# Patient Record
Sex: Male | Born: 1955 | Race: White | Hispanic: No | Marital: Married | Smoking: Former smoker
Health system: Southern US, Community
[De-identification: ages and names within clinical notes are randomized; demographics above are authoritative.]

## PROBLEM LIST (undated history)

## (undated) DIAGNOSIS — I8500 Esophageal varices without bleeding: Secondary | ICD-10-CM

## (undated) DIAGNOSIS — E079 Disorder of thyroid, unspecified: Secondary | ICD-10-CM

## (undated) DIAGNOSIS — F419 Anxiety disorder, unspecified: Secondary | ICD-10-CM

## (undated) DIAGNOSIS — I10 Essential (primary) hypertension: Secondary | ICD-10-CM

## (undated) DIAGNOSIS — K859 Acute pancreatitis without necrosis or infection, unspecified: Secondary | ICD-10-CM

## (undated) HISTORY — DX: Esophageal varices without bleeding: I85.00

## (undated) HISTORY — DX: Anxiety disorder, unspecified: F41.9

## (undated) HISTORY — DX: Acute pancreatitis without necrosis or infection, unspecified: K85.90

## (undated) HISTORY — DX: Essential (primary) hypertension: I10

## (undated) HISTORY — PX: HERNIA REPAIR: SHX51

## (undated) HISTORY — DX: Disorder of thyroid, unspecified: E07.9

---

## 2011-03-09 ENCOUNTER — Encounter (INDEPENDENT_AMBULATORY_CARE_PROVIDER_SITE_OTHER): Payer: Self-pay | Admitting: *Deleted

## 2011-05-10 ENCOUNTER — Ambulatory Visit (INDEPENDENT_AMBULATORY_CARE_PROVIDER_SITE_OTHER): Payer: Self-pay | Admitting: Internal Medicine

## 2011-05-30 ENCOUNTER — Other Ambulatory Visit (INDEPENDENT_AMBULATORY_CARE_PROVIDER_SITE_OTHER): Payer: Self-pay | Admitting: Internal Medicine

## 2011-05-30 ENCOUNTER — Encounter (INDEPENDENT_AMBULATORY_CARE_PROVIDER_SITE_OTHER): Payer: Self-pay | Admitting: Internal Medicine

## 2011-05-30 ENCOUNTER — Other Ambulatory Visit (INDEPENDENT_AMBULATORY_CARE_PROVIDER_SITE_OTHER): Payer: Self-pay | Admitting: *Deleted

## 2011-05-30 ENCOUNTER — Ambulatory Visit (INDEPENDENT_AMBULATORY_CARE_PROVIDER_SITE_OTHER): Payer: BC Managed Care – PPO | Admitting: Internal Medicine

## 2011-05-30 VITALS — BP 122/80 | HR 78 | Temp 98.1°F | Resp 20 | Ht 70.0 in | Wt 160.6 lb

## 2011-05-30 DIAGNOSIS — I10 Essential (primary) hypertension: Secondary | ICD-10-CM | POA: Insufficient documentation

## 2011-05-30 DIAGNOSIS — I864 Gastric varices: Secondary | ICD-10-CM

## 2011-05-30 DIAGNOSIS — D696 Thrombocytopenia, unspecified: Secondary | ICD-10-CM

## 2011-05-30 DIAGNOSIS — D7389 Other diseases of spleen: Secondary | ICD-10-CM

## 2011-05-30 DIAGNOSIS — I82890 Acute embolism and thrombosis of other specified veins: Secondary | ICD-10-CM

## 2011-05-30 DIAGNOSIS — I8500 Esophageal varices without bleeding: Secondary | ICD-10-CM | POA: Insufficient documentation

## 2011-05-30 DIAGNOSIS — Z8719 Personal history of other diseases of the digestive system: Secondary | ICD-10-CM | POA: Insufficient documentation

## 2011-05-30 LAB — CBC WITH DIFFERENTIAL/PLATELET
Basophils Absolute: 0.1 10*3/uL (ref 0.0–0.1)
Basophils Relative: 1 % (ref 0–1)
Eosinophils Absolute: 0.1 10*3/uL (ref 0.0–0.7)
Hemoglobin: 12.8 g/dL — ABNORMAL LOW (ref 13.0–17.0)
MCH: 30.8 pg (ref 26.0–34.0)
MCHC: 33.5 g/dL (ref 30.0–36.0)
Neutro Abs: 3.1 10*3/uL (ref 1.7–7.7)
Neutrophils Relative %: 64 % (ref 43–77)
Platelets: 101 10*3/uL — ABNORMAL LOW (ref 150–400)

## 2011-05-30 LAB — PROTIME-INR
INR: 1.01 (ref <1.50)
Prothrombin Time: 13.7 seconds (ref 11.6–15.2)

## 2011-05-30 NOTE — Patient Instructions (Signed)
Do not take more than 2 g of Tylenol per day. Please do not eat raw fish. Physician will contact you with results of ultrasound and blood work.

## 2011-05-30 NOTE — Consult Note (Signed)
Consult note dictated. Job [phone]

## 2011-05-31 LAB — COMPREHENSIVE METABOLIC PANEL
Albumin: 4.7 g/dL (ref 3.5–5.2)
Alkaline Phosphatase: 88 U/L (ref 39–117)
CO2: 24 mEq/L (ref 19–32)
Calcium: 9.5 mg/dL (ref 8.4–10.5)
Chloride: 102 mEq/L (ref 96–112)
Glucose, Bld: 106 mg/dL — ABNORMAL HIGH (ref 70–99)
Potassium: 4.1 mEq/L (ref 3.5–5.3)
Sodium: 141 mEq/L (ref 135–145)
Total Protein: 7.2 g/dL (ref 6.0–8.3)

## 2011-05-31 LAB — IRON AND TIBC
%SAT: 25 % (ref 20–55)
Iron: 83 ug/dL (ref 42–165)
TIBC: 330 ug/dL (ref 215–435)

## 2011-05-31 LAB — HEPATITIS B SURFACE ANTIGEN: Hepatitis B Surface Ag: NEGATIVE

## 2011-05-31 NOTE — Consult Note (Signed)
NAME:  Noah, Griffin                   ACCOUNT NO.:  MEDICAL RECORD NO.:  [id number]  LOCATION:                                 FACILITY:  PHYSICIAN:  Lionel December, M.D.    DATE OF BIRTH:  16-Feb-1956  DATE OF CONSULTATION:  05/30/2011 DATE OF DISCHARGE:                                CONSULTATION   REASON FOR CONSULTATION:  History of alcoholic hepatitis.  Evaluate the patient for chronic liver disease and/or cirrhosis.  HISTORY OF PRESENT ILLNESS:  Noah Griffin is a 56 year old Caucasian male who is referred through courtesy of Dr. Sherril Croon, for GI evaluation.  The patient is known to me from previous evaluation, going back to July 2009, when he was hospitalized for fulminant alcoholic pancreatitis and also noted to have alcoholic hepatitis.  He had recent lab studies by Dr. Sherril Croon, and noted to have persistent thrombocytopenia.  His serum ferritin was also elevated.  He is therefore been concerned and interested in knowing what is the stage of his liver disease.  He has no complaints.  He has very good appetite.  His weight has been stable.  He denies heartburn, nausea, vomiting, dysphagia, abdominal pain, melena, or rectal bleeding.  He has occasional constipation, but he is able to control by eating fiber rich foods.  There is no history of blood transfusion.  He did have 2 tattoos by a professional about 2 years ago.  The only time he was jaundiced was when he had severe pancreatitis in 2009.  He has not been vaccinated for hepatitis A or B. He has not had any alcohol since July 2009.  His family history is negative for chronic liver disease.  CURRENT MEDICATIONS: 1. Acetaminophen 500 mg p.o. b.i.d. p.r.n. 2. Citalopram 20 mg p.o. daily. 3. Levothyroxine 125 mcg p.o. daily. 4. Losartan/HCTZ 50/12.5 mg daily. 5. Pantoprazole 40 mg p.o. q.a.m. 6. Temazepam 15 mg p.o. at bedtime p.r.n. 7. Trazodone 50 mg p.o. at bedtime p.r.n.  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  He had pseudocyst, but he improved with medical therapy.  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  History of alcoholic hepatitis.  However, his transaminases have been normal in September 22 2009, December 2011, March 2012, August 2012, November 2012.  History of thrombocytopenia as noted above.  His platelet count has ranged between 106 and 117,000.  Hypertension.  Hypothyroidism.  History of anxiety.  Prior surgeries include right inguinal herniorrhaphy.  ALLERGIES:  NK.  FAMILY HISTORY:  Father died of pancreatic carcinoma within few months of diagnosis at age 32.  Mother lived to be in her late 83s, she had Parkinson disease.  The patient does not have any siblings.  SOCIAL HISTORY:  He is married (second marriage).  He has 2 grown up children.  His son is in good health.  Daughter had bariatric surgery and is doing fine.  He has Psychiatrist.  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  He has never smoked cigarettes, but he smokes  1 cigar a day which he has done for 3 years.  He used to drink 6-8 cans of beer every day off and on for years, but he has not had any in since July 2009.  In addition to doing photography of galaxies, he is also Civil Service fast streamer and astrophotography.  PHYSICAL EXAMINATION:  VITAL SIGNS:  Weight 160.6 pounds, he is 70 inches tall, pulse 78 per minute, blood pressure 122/80, respirations 20, and temp is 98.1. HEENT:  Conjunctivae is pink.  Sclerae nonicteric.  Oropharyngeal mucosa is normal.  No KF rings noted.  No neck masses or thyromegaly noted. GENERAL:  He does not have spider angiomata or gynecomastia. CARDIAC:  With regular rhythm.  Normal S1, S2.  No murmur or gallop noted. LUNGS:  Clear to auscultation. ABDOMEN:  Scaphoid.  Bowel sounds are  normal.  On palpation, soft, and nontender.  Spleen is not palpable.  Liver edge is indistinct below RCM span is 13 cm. RECTAL:  Deferred. EXTERNAL GENITALIA:  Normal. EXTREMITIES:  No peripheral edema or clubbing noted. NEUROLOGIC:  The patient is alert and does not have asterixis.  LABORATORY DATA:  Serum ferritin levels as follows 627 on February 19, 2011, 620 on January 31, 2011.  Blood work from January 31, 2011, WBC 4.2, H and H 12.3 and 36.5, platelet count 117,000, glucose 102, BUN 10, creatinine 0.96.  Sodium 133, potassium 4.2, chloride 93 CO2 of 23, calcium 9.5, bilirubin 1.3, AP 83, AST 28, ALT 23, total protein 7.4 with albumin of 4.9.  CT results from October 15, 2009, reveals normal-appearing liver, diffuse pancreatic atrophy with resolution of pseudo-cysteine on prior study of February 19, 2008.  Splenic vein thrombosis with gastroesophageal varices.  No evidence of ascites.  ASSESSMENT:  Noah Griffin is a pleasant 56 year old Caucasian male, who has chronic mild thrombocytopenia, mildly elevated serum ferritin levels. He has history of fulminant alcoholic pancreatitis back in July 2009, with full recovery.  His pancreas is atrophic, but he appears to have preserved function given that he is not diabetic and/or has pancreatic insufficiency.  His pancreatitis was complicated by splenic vein thrombosis, splenomegaly, and esophagogastric varices, but he has never undergone EGD.  He does not drink alcohol anymore.  His transaminases repeatedly have been normal.  His mild thrombocytopenia maybe a clue that he has underlying cirrhosis.  Even if he has cirrhosis, it appears to be well compensated and as long as this is not active, he should have good prognosis.  We may end up doing liver biopsy to get an accurate diagnosis, however, before that decision is made, we need to rule out other conditions.  RECOMMENDATIONS: 1. He will have the following lab studies; CBC,  comprehensive     chemistry panel, sed rate, ANA, AMA, INR, AFB, iron TIBC, and     ferritin, antismooth muscle antibody, alpha-1 antitrypsin, and     ceruloplasmin.  We will also check hepatitis B surface antigen and     hepatitis C virus antibody. 2. Hepatobiliary ultrasound. 3. When a bowel study has been completed and reviewed, we will     consider esophagogastroduodenoscopy and colonoscopy (screening). 4. The need for liver biopsy will be deferred until preliminary     evaluation has been completed.  We appreciate the opportunity to participate in the care of this gentleman.          ______________________________ Lionel December, M.D.     NR/MEDQ  D:  05/30/2011  T:  05/30/2011  Job:  [id number]  cc:  Dr. Sherril Croon

## 2011-06-01 LAB — MITOCHONDRIAL ANTIBODIES: Mitochondrial M2 Ab, IgG: 0.22 (ref <0.91)

## 2011-06-01 NOTE — Progress Notes (Signed)
CONSULTATION  REASON FOR CONSULTATION: History of alcoholic hepatitis. Evaluate the  patient for chronic liver disease and/or cirrhosis.  HISTORY OF PRESENT ILLNESS: Noah Griffin is a 56 year old Caucasian male who is  referred through courtesy of Dr. Sherril Croon, for GI evaluation. The patient  is known to me from previous evaluation, going back to July 2009, when  he was hospitalized for fulminant alcoholic pancreatitis and also noted  to have alcoholic hepatitis.  He had recent lab studies by Dr. Sherril Croon, and noted to have persistent  thrombocytopenia. His serum ferritin was also elevated. He is  therefore been concerned and interested in knowing what is the stage of  his liver disease.  He has no complaints. He has very good appetite. His weight has been  stable. He denies heartburn, nausea, vomiting, dysphagia, abdominal  pain, melena, or rectal bleeding. He has occasional constipation, but  he is able to control by eating fiber rich foods. There is no history  of blood transfusion. He did have 2 tattoos by a professional about 2  years ago. The only time he was jaundiced was when he had severe  pancreatitis in 2009. He has not been vaccinated for hepatitis A or B.  He has not had any alcohol since July 2009.  His family history is negative for chronic liver disease.  CURRENT MEDICATIONS:  1. Acetaminophen 500 mg p.o. b.i.d. p.r.n.  2. Citalopram 20 mg p.o. daily.  3. Levothyroxine 125 mcg p.o. daily.  4. Losartan/HCTZ 50/12.5 mg daily.  5. Pantoprazole 40 mg p.o. q.a.m.  6. Temazepam 15 mg p.o. at bedtime p.r.n.  7. Trazodone 50 mg p.o. at bedtime p.r.n.  PAST MEDICAL HISTORY: Fulminant alcoholic pancreatitis for which he was  admitted to [facility] in July 2009. He had pseudocyst, but he improved with  medical therapy. 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  History of alcoholic hepatitis. However, his transaminases have been    normal in September 22 2009, December 2011, March 2012, August 2012, November  2012.  History of thrombocytopenia as noted above. His platelet count has  ranged between 106 and 117,000.  Hypertension.  Hypothyroidism.  History of anxiety.  Prior surgeries include right inguinal herniorrhaphy.  ALLERGIES: NK.  FAMILY HISTORY: Father died of pancreatic carcinoma within few months  of diagnosis at age 75. Mother lived to be in her late 53s, she had  Parkinson disease. The patient does not have any siblings.  SOCIAL HISTORY: He is married (second marriage). He has 2 grown up  children. His son is in good health. Daughter had bariatric surgery  and is doing fine. 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 He has never smoked cigarettes, but he smokes 1 cigar a  day which he has done for 3 years. He used to drink 6-8 cans of beer  every day off and on for years, but he has not had any in since July  2009. In addition to doing photography of galaxies, he is also Ship broker and astrophotography.  PHYSICAL EXAMINATION: VITAL SIGNS: Weight 160.6 pounds, he is 70  inches tall, pulse 78 per minute, blood pressure 122/80, respirations  20, and temp is 98.1.  HEENT: Conjunctivae is pink. Sclerae nonicteric. Oropharyngeal mucosa  is normal. No KF rings noted. No neck masses or thyromegaly noted.  GENERAL: He does not have spider angiomata or gynecomastia.  CARDIAC: With regular rhythm. Normal S1, S2. No murmur or gallop  noted.  LUNGS: Clear to auscultation.  ABDOMEN: Scaphoid. Bowel sounds are normal. On palpation, soft, and  nontender. Spleen is not palpable. Liver edge is indistinct below RCM  span is 13 cm.  RECTAL: Deferred.  EXTERNAL GENITALIA: Normal.  EXTREMITIES: No peripheral edema or clubbing noted.  NEUROLOGIC: The patient is alert and does not have asterixis.  LABORATORY DATA: Serum ferritin  levels as follows 627 on February 19, 2011, 620 on January 31, 2011.  Blood work from January 31, 2011, WBC 4.2, H and H 12.3 and 36.5,  platelet count 117,000, glucose 102, BUN 10, creatinine 0.96. Sodium  133, potassium 4.2, chloride 93 CO2 of 23, calcium 9.5, bilirubin 1.3,  AP 83, AST 28, ALT 23, total protein 7.4 with albumin of 4.9.  CT results from October 15, 2009, reveals normal-appearing liver, diffuse  pancreatic atrophy with resolution of pseudo-cysteine on prior study of  February 19, 2008.  Splenic vein thrombosis with gastroesophageal varices. No evidence of  ascites.  ASSESSMENT: Noah Griffin is a pleasant 56 year old Caucasian male, who has  chronic mild thrombocytopenia, mildly elevated serum ferritin levels.  He has history of fulminant alcoholic pancreatitis back in July 2009,  with full recovery. His pancreas is atrophic, but he appears to have  preserved function given that he is not diabetic and/or has pancreatic  insufficiency. His pancreatitis was complicated by splenic vein  thrombosis, splenomegaly, and esophagogastric varices, but he has never  undergone EGD.  He does not drink alcohol anymore. His transaminases repeatedly have  been normal. His mild thrombocytopenia maybe a clue that he has  underlying cirrhosis. Even if he has cirrhosis, it appears to be well  compensated and as long as this is not active, he should have good  prognosis. We may end up doing liver biopsy to get an accurate  diagnosis, however, before that decision is made, we need to rule out  other conditions.  RECOMMENDATIONS:  1. He will have the following lab studies; CBC, comprehensive  chemistry panel, sed rate, ANA, AMA, INR, AFB, iron TIBC, and  ferritin, antismooth muscle antibody, alpha-1 antitrypsin, and  ceruloplasmin. We will also check hepatitis B surface antigen and  hepatitis C virus antibody.  2. Hepatobiliary ultrasound.  3. When a bowel study has been completed and reviewed,  we will  consider esophagogastroduodenoscopy and colonoscopy (screening).  4. The need for liver biopsy will be deferred until preliminary  evaluation has been completed.  We appreciate the opportunity to participate in the care of this  gentleman.

## 2011-06-13 ENCOUNTER — Ambulatory Visit (HOSPITAL_COMMUNITY)
Admission: RE | Admit: 2011-06-13 | Discharge: 2011-06-13 | Disposition: A | Discharge status: Home / Self Care | Payer: BC Managed Care – PPO | Source: Ambulatory Visit | Attending: Internal Medicine | Admitting: Internal Medicine

## 2011-06-13 DIAGNOSIS — I864 Gastric varices: Secondary | ICD-10-CM

## 2011-06-13 DIAGNOSIS — D696 Thrombocytopenia, unspecified: Secondary | ICD-10-CM

## 2011-06-13 DIAGNOSIS — K7460 Unspecified cirrhosis of liver: Secondary | ICD-10-CM | POA: Insufficient documentation

## 2011-06-16 ENCOUNTER — Encounter (INDEPENDENT_AMBULATORY_CARE_PROVIDER_SITE_OTHER): Payer: Self-pay

## 2011-06-20 ENCOUNTER — Telehealth (INDEPENDENT_AMBULATORY_CARE_PROVIDER_SITE_OTHER): Payer: Self-pay | Admitting: *Deleted

## 2011-06-20 NOTE — Telephone Encounter (Signed)
No varices identified. Spleen normal size. Pancreas not well seen because of overlying gas. Tammy,please call patient.

## 2011-06-20 NOTE — Telephone Encounter (Signed)
  Noah Griffin called the office on 06-19-11.He left a message that he had spoken with Dr.Rehman about the results of his U/S. He says that he has additional questions. I returned his call to get his questions and they are as follows: 1. Did the U/S show if his varices were on the outside or inside? 2. Was my Spleen okay ? 3. Pancreas was atrophied in a previous test,how did the U/S show the Pancreas doing? What are the ramifications of my pancreas, what shall I expect?  Patient may be reached at [phone]. Patient was advised that either Dr.Rehman or myself would call him back with the answers to these questions.

## 2011-06-21 NOTE — Telephone Encounter (Signed)
Noah Griffin was called and all his questions were answered. He states that he will continue to take his pantoprazole as directed by Dr.Rehman. I ask the patient was he pleased with the responses to his questions and he answered yes he was and appreciated of the quick response , no further questions at this time.

## 2011-07-04 ENCOUNTER — Other Ambulatory Visit (INDEPENDENT_AMBULATORY_CARE_PROVIDER_SITE_OTHER): Payer: Self-pay | Admitting: *Deleted

## 2011-07-04 DIAGNOSIS — K7460 Unspecified cirrhosis of liver: Secondary | ICD-10-CM

## 2011-07-17 ENCOUNTER — Encounter (INDEPENDENT_AMBULATORY_CARE_PROVIDER_SITE_OTHER): Payer: Self-pay | Admitting: *Deleted

## 2011-07-17 ENCOUNTER — Encounter (INDEPENDENT_AMBULATORY_CARE_PROVIDER_SITE_OTHER): Payer: Self-pay

## 2011-08-01 ENCOUNTER — Ambulatory Visit (INDEPENDENT_AMBULATORY_CARE_PROVIDER_SITE_OTHER): Payer: BC Managed Care – PPO | Admitting: Internal Medicine

## 2013-01-24 IMAGING — US US ABDOMEN COMPLETE
1 series · 14 of 25 positions shown · non-contrast
Comparison: CT abdomen dated 11/18/2007

CLINICAL DATA: Cirrhosis

COMPLETE ABDOMINAL ULTRASOUND

[Series 1: us abdomen complete · 0.23mm/px · 14 of 85 slices shown]
[im 1/85]
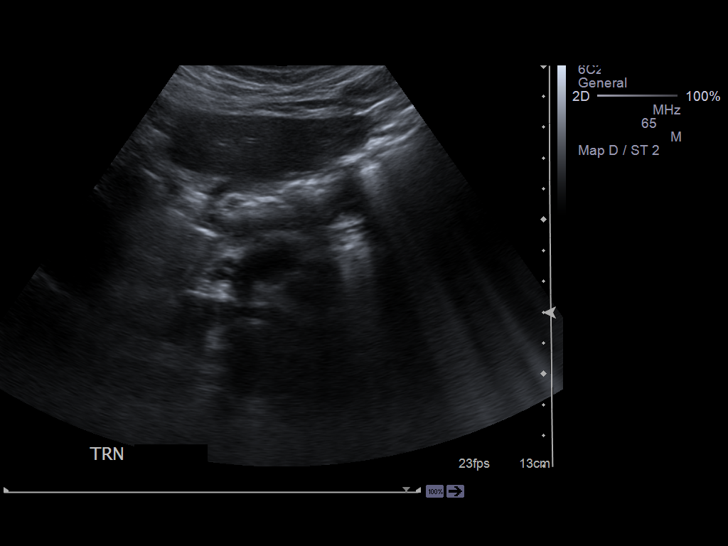
[im 8/85]
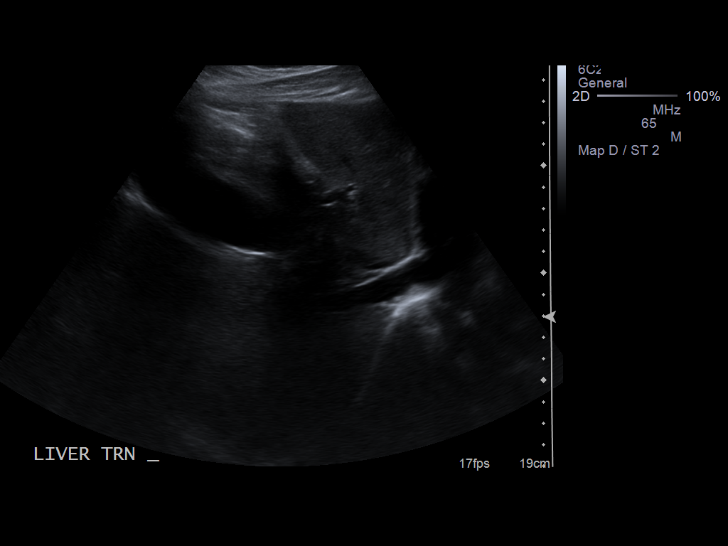
[im 15/85]
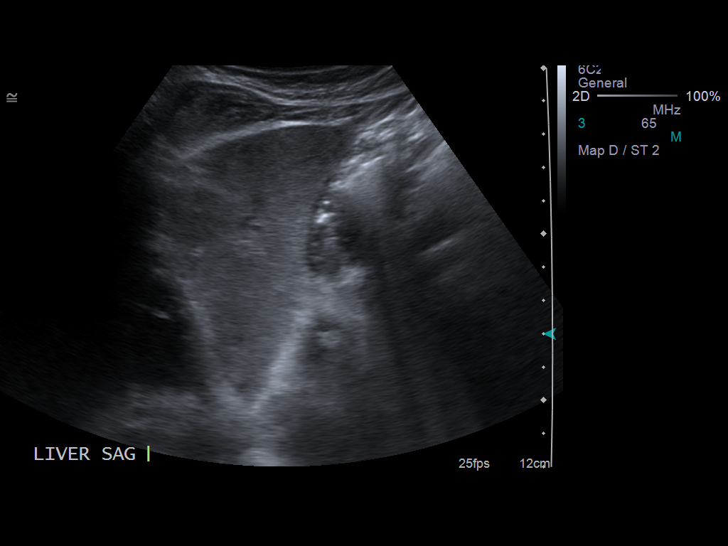
[im 22/85]
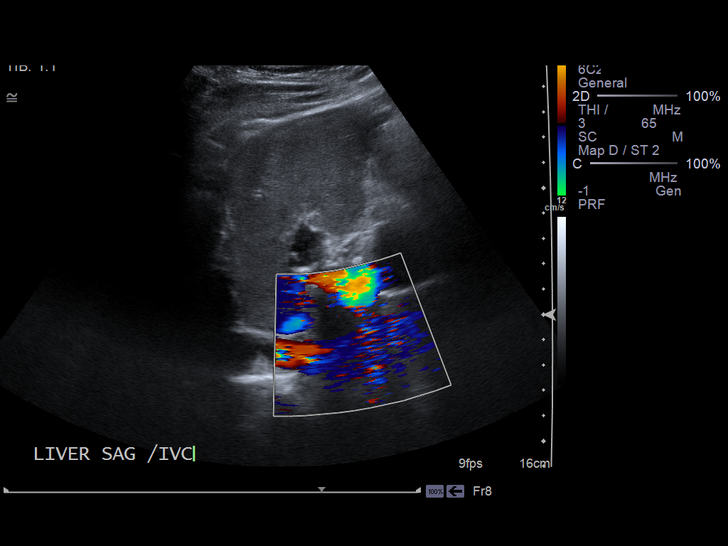
[im 29/85]
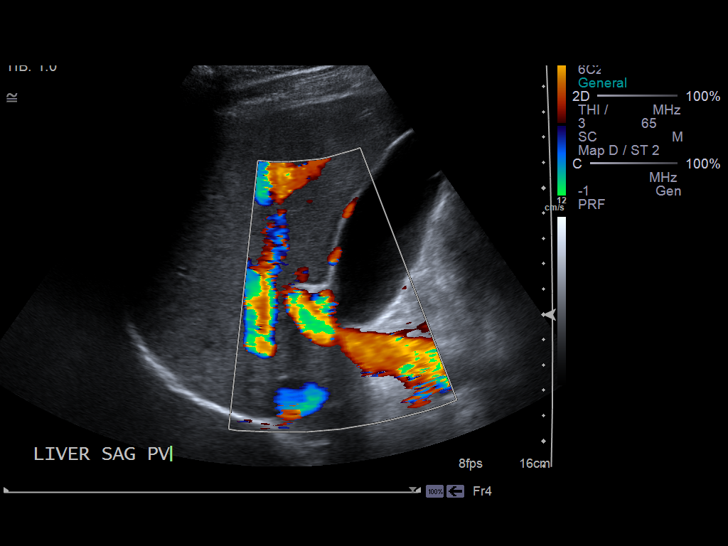
[im 32/85]
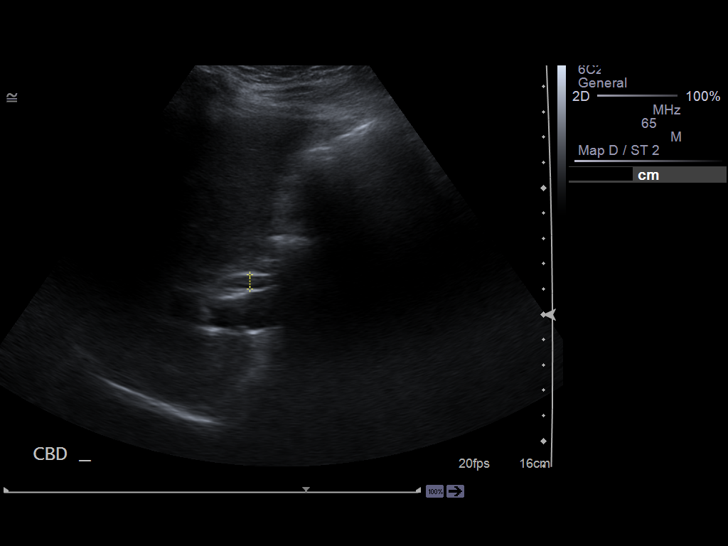
[im 39/85]
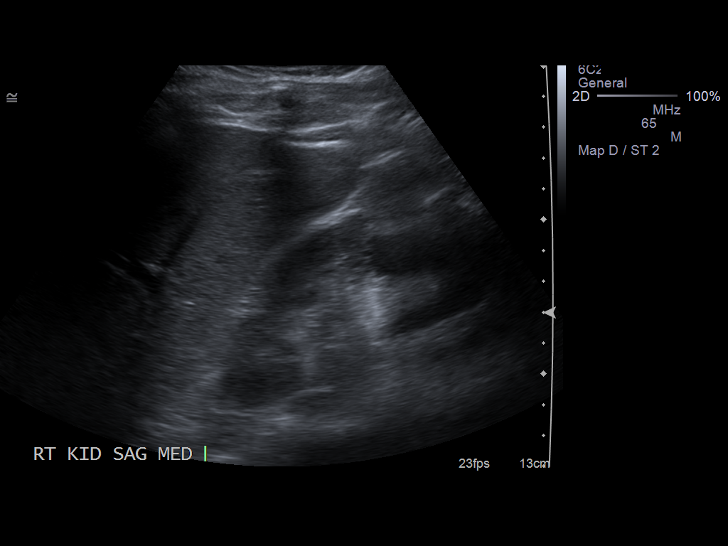
[im 46/85]
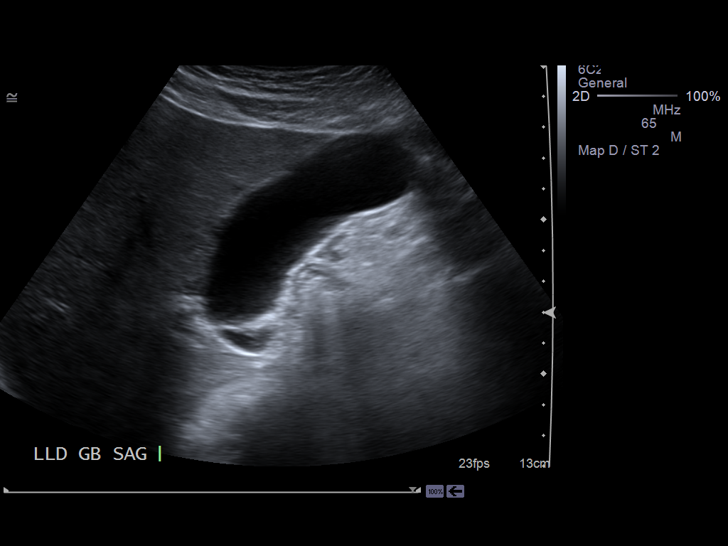
[im 53/85]
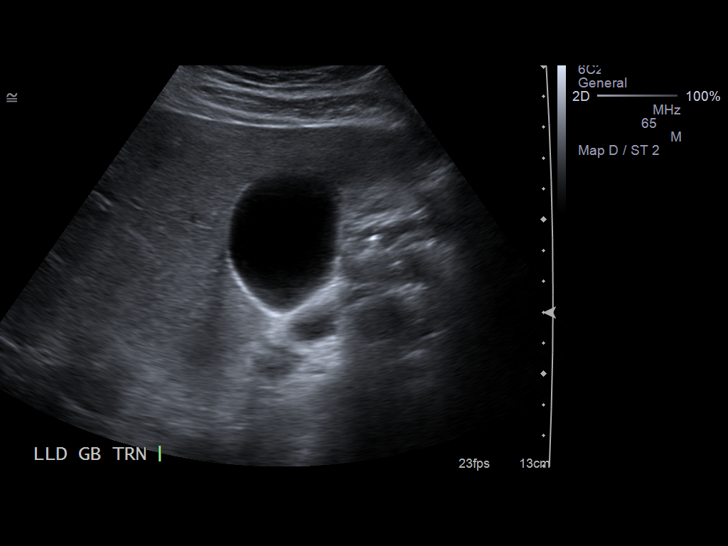
[im 57/85]
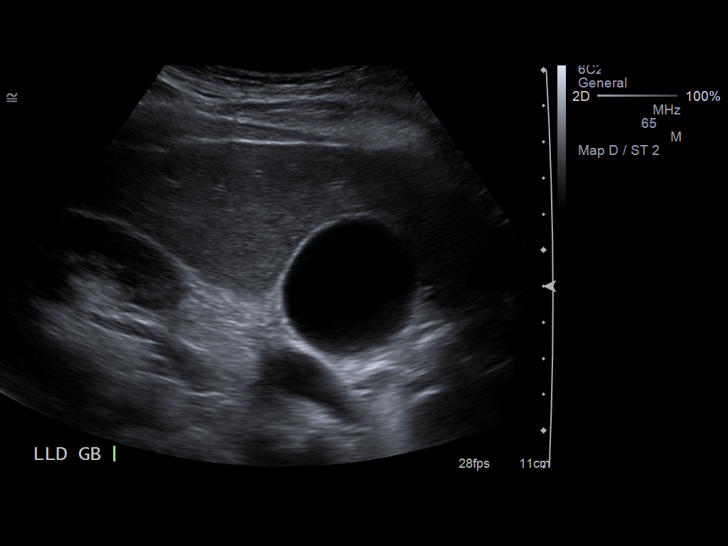
[im 64/85]
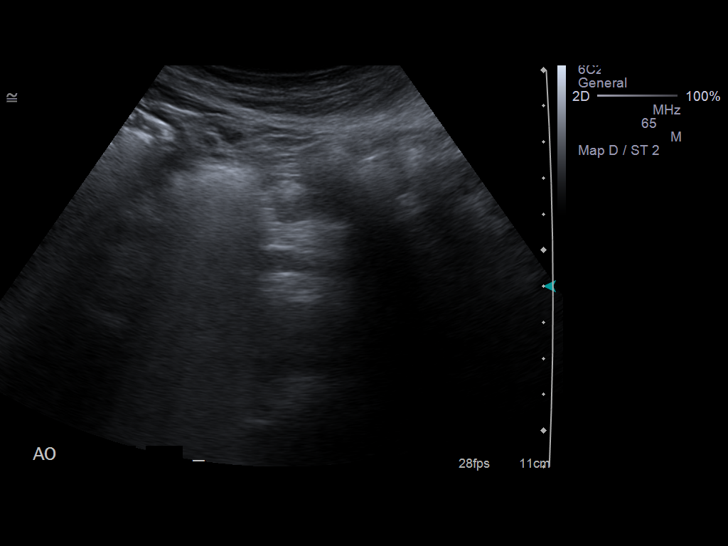
[im 71/85]
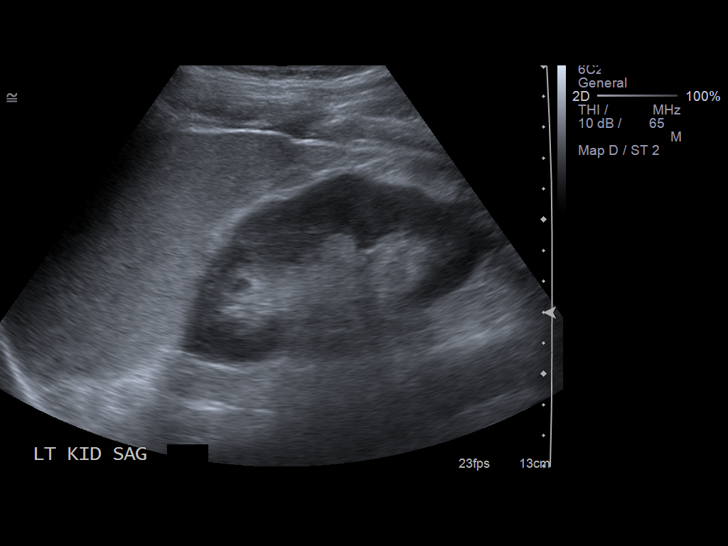
[im 78/85]
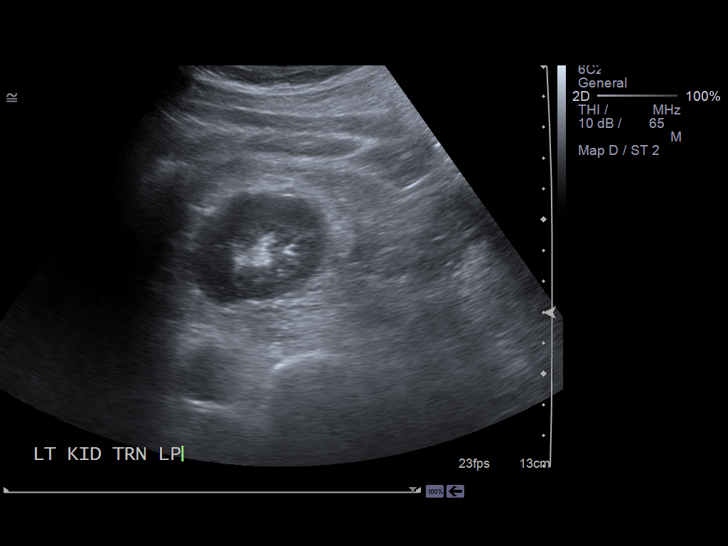
[im 85/85]
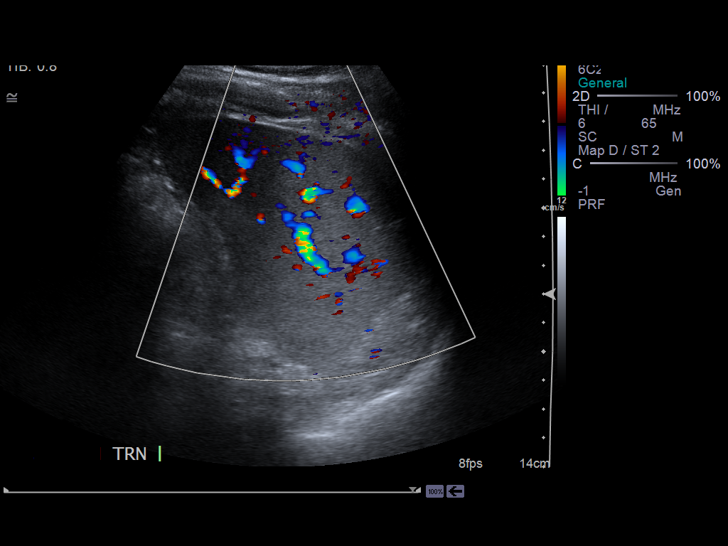

[14 of 25 positions shown; findings below may reference images not displayed]

FINDINGS: Gallbladder:  No gallstones, gallbladder wall thickening, or
pericholecystic fluid.  Negative sonographic Murphy's sign.

Common bile duct:  Measures 5.5 mm.

Liver:  No focal lesion identified.  At the upper limits of normal
for parenchymal echogenicity.

IVC:  Appears normal.

Pancreas:  Poorly visualized due to overlying bowel gas.

Spleen:  Measures 10.3 cm.

Right Kidney:  Measures 10.8 cm.  No mass or hydronephrosis.

Left Kidney:  Measures 11.3 cm.  No mass or hydronephrosis.

Abdominal aorta:  Poorly visualized due to overlying bowel gas.
IMPRESSION: Negative abdominal ultrasound.

Pancreas and abdominal aorta were not visualized due to overlying
bowel gas.

## 2013-08-13 ENCOUNTER — Telehealth (INDEPENDENT_AMBULATORY_CARE_PROVIDER_SITE_OTHER): Payer: Self-pay | Admitting: *Deleted

## 2013-08-13 ENCOUNTER — Encounter (INDEPENDENT_AMBULATORY_CARE_PROVIDER_SITE_OTHER): Payer: Self-pay | Admitting: *Deleted

## 2013-08-13 ENCOUNTER — Other Ambulatory Visit (INDEPENDENT_AMBULATORY_CARE_PROVIDER_SITE_OTHER): Payer: Self-pay | Admitting: *Deleted

## 2013-08-13 DIAGNOSIS — Z1211 Encounter for screening for malignant neoplasm of colon: Secondary | ICD-10-CM

## 2013-08-13 MED ORDER — PEG-KCL-NACL-NASULF-NA ASC-C 100 G PO SOLR: 1.0000 | Freq: Once | ORAL | Status: DC

## 2013-08-13 NOTE — Telephone Encounter (Signed)
Patient needs movi prep 

## 2013-08-13 NOTE — Telephone Encounter (Signed)
agree

## 2013-08-13 NOTE — Telephone Encounter (Signed)
  Procedure: tcs  Reason/Indication:  screening  Has patient had this procedure before?  no  If so, when, by whom and where?    Is there a family history of colon cancer?  no  Who?  What age when diagnosed?    Is patient diabetic?   no      Does patient have prosthetic heart valve?  no  Do you have a pacemaker?  no  Has patient ever had endocarditis? no  Has patient had joint replacement within last 12 months?  no  Does patient tend to be constipated or take laxatives? no  Is patient on Coumadin, Plavix and/or Aspirin? no  Medications: losartan/hctz 50/12.5 mg daily, pantoprazole 40 mg daily, lexapro 20 mg daily, levothyroxine 150 mcg daily, klonopin 1 mg nightly, trazadone 50 mg bid nightly  Allergies: nkda  Medication Adjustment:   Procedure date & time: 08/29/13 at 730

## 2013-08-18 ENCOUNTER — Encounter (INDEPENDENT_AMBULATORY_CARE_PROVIDER_SITE_OTHER): Payer: Self-pay | Admitting: *Deleted

## 2013-10-23 ENCOUNTER — Telehealth (INDEPENDENT_AMBULATORY_CARE_PROVIDER_SITE_OTHER): Payer: Self-pay | Admitting: *Deleted

## 2013-10-23 NOTE — Telephone Encounter (Signed)
agree

## 2013-10-23 NOTE — Telephone Encounter (Signed)
  Procedure: tcs  Reason/Indication:  screening  Has patient had this procedure before?  no  If so, when, by whom and where?    Is there a family history of colon cancer?  no  Who?  What age when diagnosed?    Is patient diabetic?   no      Does patient have prosthetic heart valve?  no  Do you have a pacemaker?  no  Has patient ever had endocarditis? no  Has patient had joint replacement within last 12 months?  no  Does patient tend to be constipated or take laxatives? no  Is patient on Coumadin, Plavix and/or Aspirin? no  Medications: losartan/hctz 50/12.5 mg daily, pantoprazole 40 mg daily, lexapro 20 mg daily, levothyroxine 150 mcg daily, klonopin 1 mg nightly, trazadone 50 mg bid  Allergies: nkda  Medication Adjustment:   Procedure date & time: 11/20/13 at 730

## 2013-11-11 ENCOUNTER — Encounter (HOSPITAL_COMMUNITY): Payer: Self-pay | Admitting: Pharmacy Technician

## 2013-11-19 ENCOUNTER — Other Ambulatory Visit (INDEPENDENT_AMBULATORY_CARE_PROVIDER_SITE_OTHER): Payer: Self-pay | Admitting: *Deleted

## 2013-11-19 DIAGNOSIS — Z1211 Encounter for screening for malignant neoplasm of colon: Secondary | ICD-10-CM

## 2013-11-20 ENCOUNTER — Encounter (HOSPITAL_COMMUNITY)
Admission: RE | Disposition: A | Discharge status: Home / Self Care | Payer: Self-pay | Source: Ambulatory Visit | Attending: Internal Medicine

## 2013-11-20 ENCOUNTER — Encounter (HOSPITAL_COMMUNITY): Payer: Self-pay | Admitting: *Deleted

## 2013-11-20 ENCOUNTER — Ambulatory Visit (HOSPITAL_COMMUNITY)
Admission: RE | Admit: 2013-11-20 | Discharge: 2013-11-20 | Disposition: A | Discharge status: Home / Self Care | Payer: BC Managed Care – PPO | Source: Ambulatory Visit | Attending: Internal Medicine | Admitting: Internal Medicine

## 2013-11-20 DIAGNOSIS — Z1211 Encounter for screening for malignant neoplasm of colon: Secondary | ICD-10-CM

## 2013-11-20 DIAGNOSIS — I10 Essential (primary) hypertension: Secondary | ICD-10-CM | POA: Diagnosis not present

## 2013-11-20 DIAGNOSIS — E079 Disorder of thyroid, unspecified: Secondary | ICD-10-CM | POA: Diagnosis not present

## 2013-11-20 DIAGNOSIS — K5730 Diverticulosis of large intestine without perforation or abscess without bleeding: Secondary | ICD-10-CM | POA: Insufficient documentation

## 2013-11-20 DIAGNOSIS — Z79899 Other long term (current) drug therapy: Secondary | ICD-10-CM | POA: Insufficient documentation

## 2013-11-20 DIAGNOSIS — F411 Generalized anxiety disorder: Secondary | ICD-10-CM | POA: Insufficient documentation

## 2013-11-20 HISTORY — PX: COLONOSCOPY: SHX5424

## 2013-11-20 SURGERY — COLONOSCOPY
Anesthesia: Moderate Sedation

## 2013-11-20 MED ORDER — SODIUM CHLORIDE 0.9 % IV SOLN
INTRAVENOUS | Status: DC
  Administered 2013-11-20: 07:00:00 via INTRAVENOUS

## 2013-11-20 MED ORDER — MIDAZOLAM HCL 5 MG/5ML IJ SOLN
INTRAMUSCULAR | Status: AC
  Filled 2013-11-20: qty 10

## 2013-11-20 MED ORDER — MEPERIDINE HCL 50 MG/ML IJ SOLN
INTRAMUSCULAR | Status: AC
  Filled 2013-11-20: qty 1

## 2013-11-20 MED ORDER — MIDAZOLAM HCL 5 MG/5ML IJ SOLN
INTRAMUSCULAR | Status: DC | PRN
  Administered 2013-11-20: 3 mg via INTRAVENOUS
  Administered 2013-11-20 (×5): 2 mg via INTRAVENOUS

## 2013-11-20 MED ORDER — MIDAZOLAM HCL 5 MG/5ML IJ SOLN
INTRAMUSCULAR | Status: AC
  Filled 2013-11-20: qty 5

## 2013-11-20 MED ORDER — STERILE WATER FOR IRRIGATION IR SOLN
Status: DC | PRN
  Administered 2013-11-20: 08:00:00

## 2013-11-20 MED ORDER — MEPERIDINE HCL 50 MG/ML IJ SOLN
INTRAMUSCULAR | Status: DC | PRN
  Administered 2013-11-20 (×2): 25 mg via INTRAVENOUS

## 2013-11-20 NOTE — Discharge Instructions (Signed)
Resume usual medications and high fiber diet. °No driving for 24 hours. °Next screening exam in 10 years. ° ° °Colonoscopy, Care After °Refer to this sheet in the next few weeks. These instructions provide you with information on caring for yourself after your procedure. Your health care provider may also give you more specific instructions. Your treatment has been planned according to current medical practices, but problems sometimes occur. Call your health care provider if you have any problems or questions after your procedure. °WHAT TO EXPECT AFTER THE PROCEDURE  °After your procedure, it is typical to have the following: °· A small amount of blood in your stool. °· Moderate amounts of gas and mild abdominal cramping or bloating. °HOME CARE INSTRUCTIONS °· Do not drive, operate machinery, or sign important documents for 24 hours. °· You may shower and resume your regular physical activities, but move at a slower pace for the first 24 hours. °· Take frequent rest periods for the first 24 hours. °· Walk around or put a warm pack on your abdomen to help reduce abdominal cramping and bloating. °· Drink enough fluids to keep your urine clear or pale yellow. °· You may resume your normal diet as instructed by your health care provider. Avoid heavy or fried foods that are hard to digest. °· Avoid drinking alcohol for 24 hours or as instructed by your health care provider. °· Only take over-the-counter or prescription medicines as directed by your health care provider. °· If a tissue sample (biopsy) was taken during your procedure: °¨ Do not take aspirin or blood thinners for 7 days, or as instructed by your health care provider. °¨ Do not drink alcohol for 7 days, or as instructed by your health care provider. °¨ Eat soft foods for the first 24 hours. °SEEK MEDICAL CARE IF: °You have persistent spotting of blood in your stool 2-3 days after the procedure. °SEEK IMMEDIATE MEDICAL CARE IF: °· You have more than a small  spotting of blood in your stool. °· You pass large blood clots in your stool. °· Your abdomen is swollen (distended). °· You have nausea or vomiting. °· You have a fever. °· You have increasing abdominal pain that is not relieved with medicine. °Document Released: 10/05/2003 Document Revised: 12/11/2012 Document Reviewed: 10/28/2012 °ExitCare® Patient Information ©2015 [employer]. This information is not intended to replace advice given to you by your health care provider. Make sure you discuss any questions you have with your health care provider. ° °High-Fiber Diet °Fiber is found in fruits, vegetables, and grains. A high-fiber diet encourages the addition of more whole grains, legumes, fruits, and vegetables in your diet. The recommended amount of fiber for adult males is 38 g per day. For adult females, it is 25 g per day. Pregnant and lactating women should get 28 g of fiber per day. If you have a digestive or bowel problem, ask your caregiver for advice before adding high-fiber foods to your diet. Eat a variety of high-fiber foods instead of only a select few type of foods.  °PURPOSE °· To increase stool bulk. °· To make bowel movements more regular to prevent constipation. °· To lower cholesterol. °· To prevent overeating. °WHEN IS THIS DIET USED? °· It may be used if you have constipation and hemorrhoids. °· It may be used if you have uncomplicated diverticulosis (intestine condition) and irritable bowel syndrome. °· It may be used if you need help with weight management. °· It may be used if you want   to add it to your diet as a protective measure against atherosclerosis, diabetes, and cancer. °SOURCES OF FIBER °· Whole-grain breads and cereals. °· Fruits, such as apples, oranges, bananas, berries, prunes, and pears. °· Vegetables, such as green peas, carrots, sweet potatoes, beets, broccoli, cabbage, spinach, and artichokes. °· Legumes, such split peas, soy, lentils. °· Almonds. °FIBER CONTENT IN  FOODS °Starches and Grains / Dietary Fiber (g) °· Cheerios, 1 cup / 3 g °· Corn Flakes cereal, 1 cup / 0.7 g °· Rice crispy treat cereal, 1¼ cup / 0.3 g °· Instant oatmeal (cooked), ½ cup / 2 g °· Frosted wheat cereal, 1 cup / 5.1 g °· Brown, long-grain rice (cooked), 1 cup / 3.5 g °· White, long-grain rice (cooked), 1 cup / 0.6 g °· Enriched macaroni (cooked), 1 cup / 2.5 g °Legumes / Dietary Fiber (g) °· Baked beans (canned, plain, or vegetarian), ½ cup / 5.2 g °· Kidney beans (canned), ½ cup / 6.8 g °· Pinto beans (cooked), ½ cup / 5.5 g °Breads and Crackers / Dietary Fiber (g) °· Plain or honey graham crackers, 2 squares / 0.7 g °· Saltine crackers, 3 squares / 0.3 g °· Plain, salted pretzels, 10 pieces / 1.8 g °· Whole-wheat bread, 1 slice / 1.9 g °· White bread, 1 slice / 0.7 g °· Raisin bread, 1 slice / 1.2 g °· Plain bagel, 3 oz / 2 g °· Flour tortilla, 1 oz / 0.9 g °· Corn tortilla, 1 small / 1.5 g °· Hamburger or hotdog bun, 1 small / 0.9 g °Fruits / Dietary Fiber (g) °· Apple with skin, 1 medium / 4.4 g °· Sweetened applesauce, ½ cup / 1.5 g °· Banana, ½ medium / 1.5 g °· Grapes, 10 grapes / 0.4 g °· Orange, 1 small / 2.3 g °· Raisin, 1.5 oz / 1.6 g °· Melon, 1 cup / 1.4 g °Vegetables / Dietary Fiber (g) °· Green beans (canned), ½ cup / 1.3 g °· Carrots (cooked), ½ cup / 2.3 g °· Broccoli (cooked), ½ cup / 2.8 g °· Peas (cooked), ½ cup / 4.4 g °· Mashed potatoes, ½ cup / 1.6 g °· Lettuce, 1 cup / 0.5 g °· Corn (canned), ½ cup / 1.6 g °· Tomato, ½ cup / 1.1 g °Document Released: 02/20/2005 Document Revised: 08/22/2011 Document Reviewed: 05/25/2011 °Exit[employer]® Patient Information ©2015 [employer]. This information is not intended to replace advice given to you by your health care provider. Make sure you discuss any questions you have with your health care provider. ° °

## 2013-11-20 NOTE — H&P (Signed)
Noah Griffin is an 58 y.o. male.   Chief Complaint: Patient is here for colonoscopy. HPI: Patient is a 58 year old Caucasian male who is here for screening colonoscopy. He denies abdominal pain change in his bowel habits or rectal bleeding. This is patient's first exam. Family history is negative for CRC.  Past Medical History  Diagnosis Date  . Hypertension   . Pancreatitis   . Thyroid condition   . Anxiety   . Esophageal varices     Past Surgical History  Procedure Laterality Date  . Hernia repair      Family History  Problem Relation Age of Onset  . Parkinsonism Mother   . Pancreatic cancer Father   . Healthy Son    Social History:  reports that he has been smoking Cigars.  He has never used smokeless tobacco. He reports that he does not drink alcohol or use illicit drugs.  Allergies: No Known Allergies  Medications Prior to Admission  Medication Sig Dispense Refill  . acetaminophen (TYLENOL) 500 MG tablet Take 500 mg by mouth every 8 (eight) hours as needed for mild pain.       . clonazePAM (KLONOPIN) 0.5 MG tablet Take 0.5 mg by mouth at bedtime.      Marland Kitchen escitalopram (LEXAPRO) 20 MG tablet Take 20 mg by mouth daily.      Marland Kitchen levothyroxine (SYNTHROID, LEVOTHROID) 150 MCG tablet Take 150 mcg by mouth daily before breakfast.      . losartan-hydrochlorothiazide (HYZAAR) 50-12.5 MG per tablet Take 1 tablet by mouth daily.      . pantoprazole (PROTONIX) 40 MG tablet Take 40 mg by mouth daily.      . peg 3350 powder (MOVIPREP) 100 G SOLR Take 1 kit (200 g total) by mouth once.  1 kit  0  . traZODone (DESYREL) 50 MG tablet Take 100 mg by mouth at bedtime.         No results found for this or any previous visit (from the past 48 hour(s)). No results found.  ROS  Blood pressure 132/85, pulse 56, temperature 97.9 F (36.6 C), temperature source Oral, resp. rate 19, SpO2 97.00%. Physical Exam  Constitutional:  Well-developed thin Caucasian male in NAD  HENT:   Mouth/Throat: Oropharynx is clear and moist.  Eyes: Conjunctivae are normal. No scleral icterus.  Neck: No thyromegaly present.  Cardiovascular: Normal rate, regular rhythm and normal heart sounds.   No murmur heard. Respiratory: Effort normal and breath sounds normal.  GI: Soft. He exhibits no distension and no mass. There is no tenderness.  Musculoskeletal: He exhibits no edema.  Lymphadenopathy:    He has no cervical adenopathy.  Neurological: He is alert.  Skin: Skin is warm and dry.     Assessment/Plan Average risk screening colonoscopy.  Laiana Fratus U 11/20/2013, 7:31 AM

## 2013-11-20 NOTE — Op Note (Signed)
COLONOSCOPY PROCEDURE REPORT  PATIENT:  Noah Griffin  MR#:  [id number] Birthdate:  02/11/1956, 58 y.o., male Endoscopist:  Dr. Malissa Hippo, MD Referred By:  Dr. Ignatius Specking, MD Procedure Date: 11/20/2013  Procedure:   Colonoscopy  Indications:  Patient is 58 year old Caucasian male who is undergoing average risk screening colonoscopy.  Informed Consent:  The procedure and risks were reviewed with the patient and informed consent was obtained.  Medications:  Demerol 50 mg IV Versed 13 mg IV  Description of procedure:  After a digital rectal exam was performed, that colonoscope was advanced from the anus through the rectum and colon to the area of the cecum, ileocecal valve and appendiceal orifice. The cecum was deeply intubated. These structures were well-seen and photographed for the record. From the level of the cecum and ileocecal valve, the scope was slowly and cautiously withdrawn. The mucosal surfaces were carefully surveyed utilizing scope tip to flexion to facilitate fold flattening as needed. The scope was pulled down into the rectum where a thorough exam including retroflexion was performed. Terminal ileum was also examined.  Findings:   Prep excellent. Normal mucosa of terminal ileum. Few small diverticula at sigmoid colon. Normal rectal mucosa. Focal thickening to anoderm.   Therapeutic/Diagnostic Maneuvers Performed:   None  Complications:  None  Cecal Withdrawal Time:  8 minutes  Impression:  Normal mucosa of terminal ileum. Normal colonoscopy except mild sigmoid colon diverticulosis.  Recommendations:  Standard instructions given. High fiber diet. Next screening exam in 10 years.  REHMAN,NAJEEB U  11/20/2013 8:07 AM  CC: Dr. Ignatius Specking., MD & Dr. Bonnetta Barry ref. provider found

## 2013-11-21 ENCOUNTER — Encounter (HOSPITAL_COMMUNITY): Payer: Self-pay | Admitting: Internal Medicine

## 2013-12-22 ENCOUNTER — Telehealth (INDEPENDENT_AMBULATORY_CARE_PROVIDER_SITE_OTHER): Payer: Self-pay | Admitting: *Deleted

## 2013-12-24 NOTE — Telephone Encounter (Signed)
Called patient--no answer will call back and try to speak with patient.

## 2014-01-05 ENCOUNTER — Encounter (INDEPENDENT_AMBULATORY_CARE_PROVIDER_SITE_OTHER): Payer: Self-pay | Admitting: *Deleted

## 2014-01-22 NOTE — Telephone Encounter (Signed)
Information just received back today stating that this is for a deductible and co-pay and was coded correct per [employer] billing  Will contact or mail patient a letter letting him know about this  I did call patient and he will contact hospital to see what he needs to do  And go from there.

## 2023-05-15 ENCOUNTER — Encounter (INDEPENDENT_AMBULATORY_CARE_PROVIDER_SITE_OTHER): Payer: Self-pay | Admitting: *Deleted

## 2023-06-11 ENCOUNTER — Ambulatory Visit (INDEPENDENT_AMBULATORY_CARE_PROVIDER_SITE_OTHER): Admitting: Gastroenterology

## 2023-08-02 ENCOUNTER — Ambulatory Visit (INDEPENDENT_AMBULATORY_CARE_PROVIDER_SITE_OTHER): Admitting: Gastroenterology

## 2023-08-13 ENCOUNTER — Encounter (INDEPENDENT_AMBULATORY_CARE_PROVIDER_SITE_OTHER): Payer: Self-pay | Admitting: Gastroenterology

## 2023-08-13 ENCOUNTER — Telehealth: Payer: Self-pay | Admitting: *Deleted

## 2023-08-13 ENCOUNTER — Ambulatory Visit (INDEPENDENT_AMBULATORY_CARE_PROVIDER_SITE_OTHER): Admitting: Gastroenterology

## 2023-08-13 VITALS — BP 176/90 | HR 59 | Temp 97.5°F | Ht 70.0 in | Wt 151.3 lb

## 2023-08-13 DIAGNOSIS — K852 Alcohol induced acute pancreatitis without necrosis or infection: Secondary | ICD-10-CM

## 2023-08-13 DIAGNOSIS — I82890 Acute embolism and thrombosis of other specified veins: Secondary | ICD-10-CM | POA: Diagnosis not present

## 2023-08-13 DIAGNOSIS — F1011 Alcohol abuse, in remission: Secondary | ICD-10-CM | POA: Insufficient documentation

## 2023-08-13 DIAGNOSIS — I864 Gastric varices: Secondary | ICD-10-CM | POA: Insufficient documentation

## 2023-08-13 DIAGNOSIS — F1090 Alcohol use, unspecified, uncomplicated: Secondary | ICD-10-CM | POA: Diagnosis not present

## 2023-08-13 DIAGNOSIS — K8689 Other specified diseases of pancreas: Secondary | ICD-10-CM | POA: Diagnosis not present

## 2023-08-13 DIAGNOSIS — K861 Other chronic pancreatitis: Secondary | ICD-10-CM | POA: Insufficient documentation

## 2023-08-13 DIAGNOSIS — F17210 Nicotine dependence, cigarettes, uncomplicated: Secondary | ICD-10-CM

## 2023-08-13 DIAGNOSIS — K860 Alcohol-induced chronic pancreatitis: Secondary | ICD-10-CM

## 2023-08-13 NOTE — Patient Instructions (Signed)
 Schedule EGD Alcohol and cigarette avoidance Call back around September 2025 to schedule screening colonoscopy

## 2023-08-13 NOTE — Telephone Encounter (Signed)
 Pt seen in office today. Had to go home and look at calendar prior to schedule EGD. He is on for 6/20 at 9am. Patient aware wont receive instructions in time so we discussed verbally over the phone in detail. He voiced understanding.

## 2023-08-13 NOTE — H&P (View-Only) (Signed)
 [name], M.D. Gastroenterology & Hepatology [facility] Gastroenterology [address] [place], [place] [address] Primary Care Physician: Orlena Bitters, MD [address] [place] [address]  Referring MD: PCP  Chief Complaint: Splenic vein thrombosis  History of Present Illness: Noah Griffin is a 68 y.o. 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  Patient reports that in February 2025 he presented new onset of pain in his LUQ. He states that the pain was very severe and had to go to the ER. The patient denies at that time having any nausea, vomiting, fever, chills, hematochezia, melena, hematemesis, abdominal distention, diarrhea, jaundice, pruritus. He has progressively lost weight after he stopped drinking alcohol but no acute changes seen. Cannot eat a lot as he may feel full.  Upon review of the patient's medical chart, the patient went to [facility] on 04/13/2023 after presenting abdominal pain.  CT scan of the abdomen and pelvis with IV contrast was performed and report showed presence of gastrosplenic varices, extensive soft tissue edema throughout the small bowel mesentery with sparing of the large bowel mesentery.  Given concern for possible mesenteric ischemia a repeat CT scan was performed which showed a small volume of ascites.  Due to persistent concern of bowel ischemia, general esophagogastroduodenospy requested transferring the patient to [facility].  Patient underwent a repeat CT of the abdomen with oral contrast that did not show any closed-loop or obstruction with patent main mesenteric vessels.  Patient was discharged home on senna.  Patient reports that since he was discharged from the hospital, his pain completely went away.  The  patient was seen at [facility] on 06/05/2023 after presenting a fall due to hyponatremia.  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  At that time he was found to have hyponatremia.  Patient has been on Robaxin and tramadol for pain in his abdomen.  Most recent labs from 06/08/2023 showed a sodium of 133, potassium 3.4, chloride 96, creatinine 0.81, BUN 5, AST 60, ALT 68, alkaline phosphatase 100, total bilirubin 1.1, albumin 3.0, CBC with WBC 5.4, hemoglobin 9.9, platelets 103.  Last UJW:JXBJY  Last Colonoscopy:11/20/2013 Prep excellent. Normal mucosa of terminal ileum. Few small diverticula at sigmoid colon. Normal rectal mucosa. Focal thickening to anoderm  FHx: neg for any gastrointestinal/liver disease, father pancreatic cancer at 32 years Social: quit drinking alcohol 11 years ago - used to be a heavy drinker , former smoking but quit in February 2025. No illicit drug use Surgical: abdominal hernia  Past Medical History: Past Medical History:  Diagnosis Date   Anxiety    Esophageal varices (HCC)    Hypertension    Pancreatitis    Thyroid condition     Past Surgical History: Past Surgical History:  Procedure Laterality Date   COLONOSCOPY N/A 11/20/2013   Procedure: COLONOSCOPY;  Surgeon: Ruby Corporal, MD;  [facility];  Service: Endoscopy;  Laterality: N/A;  730-rescheduled same time Ann notified pt   HERNIA REPAIR      Family History: Family History  Problem Relation Age of Onset   Parkinsonism Mother    Pancreatic cancer Father    Healthy Son     Social History: Social History   Tobacco  Use  Smoking Status Former   Types: Cigars  Smokeless Tobacco Never  Tobacco Comments   patient smokes 1 cigar a day   Social History   Substance and Sexual Activity  Alcohol Use No   Comment: Quit using alcohol 3-4 years ago  following acute Pancreatitis Attack   Social History   Substance and Sexual Activity  Drug Use No    Allergies: No Known Allergies  Medications: Current Outpatient Medications  Medication Sig Dispense Refill   Ascorbic Acid (VITAMIN C) 1000 MG tablet Take 1,000 mg by mouth daily.     ciclopirox (PENLAC) 8 % solution Apply topically at bedtime. Apply over nail and surrounding skin. Apply daily over previous coat. After seven (7) days, may remove with alcohol and continue cycle.     clonazePAM (KLONOPIN) 0.5 MG tablet Take 0.5 mg by mouth at bedtime.     escitalopram (LEXAPRO) 20 MG tablet Take 20 mg by mouth daily.     Levothyroxine Sodium 200 MCG/ML SOLN Take 250 mcg by mouth daily before breakfast.     OVER THE COUNTER MEDICATION Vit D daily Magnesium 200 mg daily     pantoprazole (PROTONIX) 40 MG tablet Take 40 mg by mouth daily.     traZODone (DESYREL) 50 MG tablet Take 100 mg by mouth at bedtime.      valsartan (DIOVAN) 160 MG tablet Take 160 mg by mouth daily.     No current facility-administered medications for this visit.    Review of Systems: GENERAL: negative for malaise, night sweats HEENT: No changes in hearing or vision, no nose bleeds or other nasal problems. NECK: Negative for lumps, goiter, pain and significant neck swelling RESPIRATORY: Negative for cough, wheezing CARDIOVASCULAR: Negative for chest pain, leg swelling, palpitations, orthopnea GI: SEE HPI MUSCULOSKELETAL: Negative for joint pain or swelling, back pain, and muscle pain. SKIN: Negative for lesions, rash PSYCH: Negative for sleep disturbance, mood disorder and recent psychosocial stressors. HEMATOLOGY Negative for prolonged bleeding, bruising easily, and swollen nodes. ENDOCRINE: Negative for cold or heat intolerance, polyuria, polydipsia and goiter. NEURO: negative for tremor, gait imbalance, syncope and seizures. The remainder of the review of systems is noncontributory.   Physical Exam: BP  (!) 176/90 (BP Location: Left Arm, Patient Position: Sitting, Cuff Size: Normal)   Pulse (!) 59   Temp (!) 97.5 F (36.4 C) (Temporal)   Ht 5\' 10"  (1.778 m)   Wt 151 lb 4.8 oz (68.6 kg)   BMI 21.71 kg/m  GENERAL: The patient is AO x3, in no acute distress. HEENT: Head is normocephalic and atraumatic. EOMI are intact. Mouth is well hydrated and without lesions. NECK: Supple. No masses LUNGS: Clear to auscultation. No presence of rhonchi/wheezing/rales. Adequate chest expansion HEART: RRR, normal s1 and s2. ABDOMEN: Soft, nontender, no guarding, no peritoneal signs, and nondistended. BS +. No masses. EXTREMITIES: Without any cyanosis, clubbing, rash, lesions or edema. NEUROLOGIC: AOx3, no focal motor deficit. SKIN: no jaundice, no rashes   Imaging/Labs: as above  I personally reviewed and interpreted the available labs, imaging and endoscopic files.  Impression and Plan: Noah Griffin is a 68 y.o. 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  Patient developed acute onset of left upper quadrant abdominal pain and was incidentally found to have significant splenic vein thrombosis with collateral veins.  There was concern for possible bowel ischemia but  none of the imaging findings were supportive of this and fortunately the abdominal pain resolved.  Currently, the patient is asymptomatic.  We discussed with the patient that the presence of portal vein thrombosis likely developed after he presented his episodes of acute pancreatitis due to alcohol use.  Furthermore, his chronic smoking may have led to ongoing pancreatic injury which may have led to aggravation of his vascular issue.  He has not presented any signs of EPI but he had evidence of pancreatic atrophy on most recent imaging.  We  discussed with the patient that the next best step will be to proceed with an EGD to assess if there was presence of gastric varices before discussing his case with interventional radiology.  I do not consider there is a role for anticoagulation as of now given the chronicity of his thrombosis, but depending on the future discussion with interventional radiology, we may consider an evaluation by hematology.  I strongly advised the patient to continue avoiding alcohol and cigarette smoking, which he understood.  Patient will be due for repeat colonoscopy in September 2025, he will need to proceed with this at that time.  -Schedule EGD -Alcohol and cigarette avoidance -Patient should call back around September 2025 to schedule screening colonoscopy  All questions were answered.      [name], MD Gastroenterology and Hepatology [facility] Gastroenterology

## 2023-08-13 NOTE — Progress Notes (Unsigned)
 Noah Griffin, M.D. Gastroenterology & Hepatology [facility] Gastroenterology [address] [place], [place] [address] Primary Care Physician: Orlena Bitters, MD [address] [place] [address]  Referring MD: PCP  Chief Complaint: Splenic vein thrombosis  History of Present Illness: Noah Griffin is a 68 y.o. 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  Patient reports that in February 2025 he presented new onset of pain in his LUQ. He states that the pain was very severe and had to go to the ER. The patient denies at that time having any nausea, vomiting, fever, chills, hematochezia, melena, hematemesis, abdominal distention, diarrhea, jaundice, pruritus. He has progressively lost weight after he stopped drinking alcohol but no acute changes seen. Cannot eat a lot as he may feel full.  Upon review of the patient's medical chart, the patient went to [facility] on 04/13/2023 after presenting abdominal pain.  CT scan of the abdomen and pelvis with IV contrast was performed and report showed presence of gastrosplenic varices, extensive soft tissue edema throughout the small bowel mesentery with sparing of the large bowel mesentery.  Given concern for possible mesenteric ischemia a repeat CT scan was performed which showed a small volume of ascites.  Due to persistent concern of bowel ischemia, general esophagogastroduodenospy requested transferring the patient to [facility].  Patient underwent a repeat CT of the abdomen with oral contrast that did not show any closed-loop or obstruction with patent main mesenteric vessels.  Patient was discharged home on senna.  Patient reports that since he was discharged from the hospital, his pain completely went away.  The  patient was seen at [facility] on 06/05/2023 after presenting a fall due to hyponatremia.  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  At that time he was found to have hyponatremia.  Patient has been on Robaxin and tramadol for pain in his abdomen.  Most recent labs from 06/08/2023 showed a sodium of 133, potassium 3.4, chloride 96, creatinine 0.81, BUN 5, AST 60, ALT 68, alkaline phosphatase 100, total bilirubin 1.1, albumin 3.0, CBC with WBC 5.4, hemoglobin 9.9, platelets 103.  Last UJW:JXBJY  Last Colonoscopy:11/20/2013 Prep excellent. Normal mucosa of terminal ileum. Few small diverticula at sigmoid colon. Normal rectal mucosa. Focal thickening to anoderm  FHx: neg for any gastrointestinal/liver disease, father pancreatic cancer at 32 years Social: quit drinking alcohol 11 years ago - used to be a heavy drinker , former smoking but quit in February 2025. No illicit drug use Surgical: abdominal hernia  Past Medical History: Past Medical History:  Diagnosis Date   Anxiety    Esophageal varices (HCC)    Hypertension    Pancreatitis    Thyroid condition     Past Surgical History: Past Surgical History:  Procedure Laterality Date   COLONOSCOPY N/A 11/20/2013   Procedure: COLONOSCOPY;  Surgeon: Ruby Corporal, MD;  [facility];  Service: Endoscopy;  Laterality: N/A;  730-rescheduled same time Ann notified pt   HERNIA REPAIR      Family History: Family History  Problem Relation Age of Onset   Parkinsonism Mother    Pancreatic cancer Father    Healthy Son     Social History: Social History   Tobacco  Use  Smoking Status Former   Types: Cigars  Smokeless Tobacco Never  Tobacco Comments   patient smokes 1 cigar a day   Social History   Substance and Sexual Activity  Alcohol Use No   Comment: Quit using alcohol 3-4 years ago  following acute Pancreatitis Attack   Social History   Substance and Sexual Activity  Drug Use No    Allergies: No Known Allergies  Medications: Current Outpatient Medications  Medication Sig Dispense Refill   Ascorbic Acid (VITAMIN C) 1000 MG tablet Take 1,000 mg by mouth daily.     ciclopirox (PENLAC) 8 % solution Apply topically at bedtime. Apply over nail and surrounding skin. Apply daily over previous coat. After seven (7) days, may remove with alcohol and continue cycle.     clonazePAM (KLONOPIN) 0.5 MG tablet Take 0.5 mg by mouth at bedtime.     escitalopram (LEXAPRO) 20 MG tablet Take 20 mg by mouth daily.     Levothyroxine Sodium 200 MCG/ML SOLN Take 250 mcg by mouth daily before breakfast.     OVER THE COUNTER MEDICATION Vit D daily Magnesium 200 mg daily     pantoprazole (PROTONIX) 40 MG tablet Take 40 mg by mouth daily.     traZODone (DESYREL) 50 MG tablet Take 100 mg by mouth at bedtime.      valsartan (DIOVAN) 160 MG tablet Take 160 mg by mouth daily.     No current facility-administered medications for this visit.    Review of Systems: GENERAL: negative for malaise, night sweats HEENT: No changes in hearing or vision, no nose bleeds or other nasal problems. NECK: Negative for lumps, goiter, pain and significant neck swelling RESPIRATORY: Negative for cough, wheezing CARDIOVASCULAR: Negative for chest pain, leg swelling, palpitations, orthopnea GI: SEE HPI MUSCULOSKELETAL: Negative for joint pain or swelling, back pain, and muscle pain. SKIN: Negative for lesions, rash PSYCH: Negative for sleep disturbance, mood disorder and recent psychosocial stressors. HEMATOLOGY Negative for prolonged bleeding, bruising easily, and swollen nodes. ENDOCRINE: Negative for cold or heat intolerance, polyuria, polydipsia and goiter. NEURO: negative for tremor, gait imbalance, syncope and seizures. The remainder of the review of systems is noncontributory.   Physical Exam: BP  (!) 176/90 (BP Location: Left Arm, Patient Position: Sitting, Cuff Size: Normal)   Pulse (!) 59   Temp (!) 97.5 F (36.4 C) (Temporal)   Ht 5\' 10"  (1.778 m)   Wt 151 lb 4.8 oz (68.6 kg)   BMI 21.71 kg/m  GENERAL: The patient is AO x3, in no acute distress. HEENT: Head is normocephalic and atraumatic. EOMI are intact. Mouth is well hydrated and without lesions. NECK: Supple. No masses LUNGS: Clear to auscultation. No presence of rhonchi/wheezing/rales. Adequate chest expansion HEART: RRR, normal s1 and s2. ABDOMEN: Soft, nontender, no guarding, no peritoneal signs, and nondistended. BS +. No masses. EXTREMITIES: Without any cyanosis, clubbing, rash, lesions or edema. NEUROLOGIC: AOx3, no focal motor deficit. SKIN: no jaundice, no rashes   Imaging/Labs: as above  I personally reviewed and interpreted the available labs, imaging and endoscopic files.  Impression and Plan: Myers Tutterow is a 68 y.o. 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  Patient developed acute onset of left upper quadrant abdominal pain and was incidentally found to have significant splenic vein thrombosis with collateral veins.  There was concern for possible bowel ischemia but  none of the imaging findings were supportive of this and fortunately the abdominal pain resolved.  Currently, the patient is asymptomatic.  We discussed with the patient that the presence of portal vein thrombosis likely developed after he presented his episodes of acute pancreatitis due to alcohol use.  Furthermore, his chronic smoking may have led to ongoing pancreatic injury which may have led to aggravation of his vascular issue.  He has not presented any signs of EPI but he had evidence of pancreatic atrophy on most recent imaging.  We  discussed with the patient that the next best step will be to proceed with an EGD to assess if there was presence of gastric varices before discussing his case with interventional radiology.  I do not consider there is a role for anticoagulation as of now given the chronicity of his thrombosis, but depending on the future discussion with interventional radiology, we may consider an evaluation by hematology.  I strongly advised the patient to continue avoiding alcohol and cigarette smoking, which he understood.  Patient will be due for repeat colonoscopy in September 2025, he will need to proceed with this at that time.  -Schedule EGD -Alcohol and cigarette avoidance -Patient should call back around September 2025 to schedule screening colonoscopy  All questions were answered.      Noah Cress, MD Gastroenterology and Hepatology [facility] Gastroenterology

## 2023-08-17 ENCOUNTER — Telehealth (INDEPENDENT_AMBULATORY_CARE_PROVIDER_SITE_OTHER): Payer: Self-pay

## 2023-08-17 NOTE — Telephone Encounter (Signed)
 Patient requesting a call from Dr. Sammi Crick [phone]. Patient says since he was since last here in the office he has several questions he would like to ask Dr. Sammi Crick. He says he was told that his pancreas was shrinking.   He says he has concerns of whether he has chronic pancreatitis, and what does this mean?  He would like to know when the symptoms will start and what to expect?  He would like to know the mortality rate is for Chronic Pancreatitis is ?  Patient has asked for a direct call from Dr. Sammi Crick.   Please advise.

## 2023-08-17 NOTE — Telephone Encounter (Signed)
 I called the patient today and discussed findings on imaging that may suggest early chronic pancreatitis.  He is not presenting any pain or signs of EPI.  We discussed diagnosis and prognosis.  He understood and agreed.  He will avoid alcohol and cigarette use.

## 2023-08-20 NOTE — Telephone Encounter (Signed)
 Noted

## 2023-08-24 ENCOUNTER — Encounter (HOSPITAL_COMMUNITY): Payer: Self-pay | Admitting: Gastroenterology

## 2023-08-24 ENCOUNTER — Other Ambulatory Visit: Payer: Self-pay

## 2023-08-24 ENCOUNTER — Ambulatory Visit (HOSPITAL_COMMUNITY)
Admission: RE | Admit: 2023-08-24 | Discharge: 2023-08-24 | Disposition: A | Discharge status: Home / Self Care | Attending: Gastroenterology | Admitting: Gastroenterology

## 2023-08-24 ENCOUNTER — Encounter (HOSPITAL_COMMUNITY)
Admission: RE | Disposition: A | Discharge status: Home / Self Care | Payer: Self-pay | Source: Home / Self Care | Attending: Gastroenterology

## 2023-08-24 ENCOUNTER — Ambulatory Visit (HOSPITAL_COMMUNITY): Payer: Self-pay | Admitting: Anesthesiology

## 2023-08-24 DIAGNOSIS — I10 Essential (primary) hypertension: Secondary | ICD-10-CM | POA: Diagnosis not present

## 2023-08-24 DIAGNOSIS — K5730 Diverticulosis of large intestine without perforation or abscess without bleeding: Secondary | ICD-10-CM | POA: Insufficient documentation

## 2023-08-24 DIAGNOSIS — F419 Anxiety disorder, unspecified: Secondary | ICD-10-CM | POA: Insufficient documentation

## 2023-08-24 DIAGNOSIS — I82891 Chronic embolism and thrombosis of other specified veins: Secondary | ICD-10-CM | POA: Diagnosis not present

## 2023-08-24 DIAGNOSIS — K7460 Unspecified cirrhosis of liver: Secondary | ICD-10-CM | POA: Insufficient documentation

## 2023-08-24 DIAGNOSIS — I864 Gastric varices: Secondary | ICD-10-CM | POA: Diagnosis not present

## 2023-08-24 DIAGNOSIS — Z87891 Personal history of nicotine dependence: Secondary | ICD-10-CM | POA: Diagnosis not present

## 2023-08-24 DIAGNOSIS — K3189 Other diseases of stomach and duodenum: Secondary | ICD-10-CM | POA: Diagnosis not present

## 2023-08-24 DIAGNOSIS — Z79899 Other long term (current) drug therapy: Secondary | ICD-10-CM | POA: Insufficient documentation

## 2023-08-24 DIAGNOSIS — Z79890 Hormone replacement therapy (postmenopausal): Secondary | ICD-10-CM | POA: Diagnosis not present

## 2023-08-24 DIAGNOSIS — I81 Portal vein thrombosis: Secondary | ICD-10-CM | POA: Diagnosis not present

## 2023-08-24 DIAGNOSIS — F17290 Nicotine dependence, other tobacco product, uncomplicated: Secondary | ICD-10-CM | POA: Diagnosis not present

## 2023-08-24 HISTORY — PX: ESOPHAGOGASTRODUODENOSCOPY: SHX5428

## 2023-08-24 SURGERY — EGD (ESOPHAGOGASTRODUODENOSCOPY)
Anesthesia: General

## 2023-08-24 MED ORDER — LIDOCAINE 2% (20 MG/ML) 5 ML SYRINGE
INTRAMUSCULAR | Status: DC | PRN
  Administered 2023-08-24: 60 mg via INTRAVENOUS

## 2023-08-24 MED ORDER — GLYCOPYRROLATE PF 0.2 MG/ML IJ SOSY
PREFILLED_SYRINGE | INTRAMUSCULAR | Status: DC | PRN
Start: 2023-08-24 — End: 2023-08-24
  Administered 2023-08-24: .2 mg via INTRAVENOUS

## 2023-08-24 MED ORDER — LACTATED RINGERS IV SOLN: INTRAVENOUS | Status: DC

## 2023-08-24 MED ORDER — PROPOFOL 500 MG/50ML IV EMUL
INTRAVENOUS | Status: DC | PRN
Start: 2023-08-24 — End: 2023-08-24
  Administered 2023-08-24: 100 mg via INTRAVENOUS
  Administered 2023-08-24: 200 mg via INTRAVENOUS

## 2023-08-24 NOTE — Transfer of Care (Signed)
 Immediate Anesthesia Transfer of Care Note  Patient: Noah Griffin  Procedure(s) Performed: EGD (ESOPHAGOGASTRODUODENOSCOPY)  Patient Location: Endoscopy Unit  Anesthesia Type:General  Level of Consciousness: drowsy  Airway & Oxygen Therapy: Patient Spontanous Breathing  Post-op Assessment: Report given to RN and Post -op Vital signs reviewed and stable  Post vital signs: Reviewed and stable  Last Vitals:  Vitals Value Taken Time  BP 117/77 08/24/23 09:13  Temp 36.7 C 08/24/23 09:13  Pulse 40 08/24/23 09:13  Resp 15 08/24/23 09:13  SpO2 100 % 08/24/23 09:13    Last Pain:  Vitals:   08/24/23 0913  TempSrc: Axillary  PainSc: 0-No pain      Patients Stated Pain Goal: 8 (08/24/23 0738)  Complications: No notable events documented.

## 2023-08-24 NOTE — Interval H&P Note (Signed)
 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  The various methods of treatment have been discussed with the patient and family. After consideration of risks, benefits and other options for treatment, the patient has consented to  Procedure(s) with comments: EGD (ESOPHAGOGASTRODUODENOSCOPY) (N/A) - 900AM, ASA 1-2 as a surgical intervention.  The patient's history has been reviewed, patient examined, no change in status, stable for surgery.  I have reviewed the patient's chart and labs.  Questions were answered to the patient's satisfaction.     [name]

## 2023-08-24 NOTE — Anesthesia Preprocedure Evaluation (Signed)
 Anesthesia Evaluation  Patient identified by MRN, date of birth, ID band Patient awake    Reviewed: Allergy & Precautions, H&P , NPO status , Patient's Chart, lab work & pertinent test results, reviewed documented beta blocker date and time   Airway Mallampati: II  TM Distance: >3 FB Neck ROM: full    Dental no notable dental hx.    Pulmonary neg pulmonary ROS, former smoker   Pulmonary exam normal breath sounds clear to auscultation       Cardiovascular Exercise Tolerance: Good hypertension, negative cardio ROS  Rhythm:regular Rate:Normal     Neuro/Psych negative neurological ROS  negative psych ROS   GI/Hepatic negative GI ROS,,,(+) Cirrhosis   Esophageal Varices      Endo/Other  negative endocrine ROS    Renal/GU negative Renal ROS  negative genitourinary   Musculoskeletal   Abdominal   Peds  Hematology negative hematology ROS (+)   Anesthesia Other Findings   Reproductive/Obstetrics negative OB ROS                             Anesthesia Physical Anesthesia Plan  ASA: 3  Anesthesia Plan: General   Post-op Pain Management:    Induction:   PONV Risk Score and Plan: Propofol infusion  Airway Management Planned:   Additional Equipment:   Intra-op Plan:   Post-operative Plan:   Informed Consent: I have reviewed the patients History and Physical, chart, labs and discussed the procedure including the risks, benefits and alternatives for the proposed anesthesia with the patient or authorized representative who has indicated his/her understanding and acceptance.     Dental Advisory Given  Plan Discussed with: CRNA  Anesthesia Plan Comments:        Anesthesia Quick Evaluation

## 2023-08-24 NOTE — Anesthesia Procedure Notes (Signed)
 Date/Time: 08/24/2023 8:57 AM  Performed by: Verline Glow, CRNAPre-anesthesia Checklist: Patient identified, Emergency Drugs available, Suction available, Timeout performed and Patient being monitored Patient Re-evaluated:Patient Re-evaluated prior to induction Oxygen Delivery Method: Nasal Cannula

## 2023-08-24 NOTE — Discharge Instructions (Signed)
 You are being discharged to home.  Resume your previous diet.  Alcohol and smoking avoidance. Will refer to interventional radiology for evaluation of possible BRTO.

## 2023-08-24 NOTE — Op Note (Signed)
 [facility] Patient Name: Noah Griffin Procedure Date: 08/24/2023 8:50 AM MRN: [id number] Date of Birth: 11/16/1955 Attending MD: [name] , , [id number] CSN: [id number] Age: 68 Admit Type: Outpatient Procedure:                Upper GI endoscopy Indications:              Suspected gastric varices Providers:                [name], Troy Furnish. Hazeline Lister RN, RN, Alisa App, Parmele Butter, Technician Referring MD:              Medicines:                Monitored Anesthesia Care Complications:            No immediate complications. Estimated Blood Loss:     Estimated blood loss: none. Procedure:                Pre-Anesthesia Assessment:                           - Prior to the procedure, a History and Physical                            was performed, and patient medications, allergies                            and sensitivities were reviewed. The patient's                            tolerance of previous anesthesia was reviewed.                           - The risks and benefits of the procedure and the                            sedation options and risks were discussed with the                            patient. All questions were answered and informed                            consent was obtained.                           After obtaining informed consent, the endoscope was                            passed under direct vision. Throughout the                            procedure, the patient's blood pressure, pulse, and                            oxygen saturations  were monitored continuously. The                            [id number]-[id number] ([id number]) scope was introduced through the                            mouth, and advanced to the second part of duodenum.                            The upper GI endoscopy was accomplished without                            difficulty. The patient tolerated the procedure                             well. Scope In: 9:03:29 AM Scope Out: 9:09:00 AM Total Procedure Duration: 0 hours 5 minutes 31 seconds  Findings:      The examined esophagus was normal.      The Z-line was regular and was found 40 cm from the incisors.      Type 1 isolated gastric varices (IGV1, varices located in the fundus)       with no bleeding were found on the greater curvature of the stomach.       There were no stigmata of recent bleeding. They were medium in largest       diameter.      The examined duodenum was normal. Impression:               - Normal esophagus.                           - Z-line regular, 40 cm from the incisors.                           - Type 1 isolated gastric varices (IGV1, varices                            located in the fundus), without bleeding.                           - Normal examined duodenum.                           - No specimens collected. Moderate Sedation:      Per Anesthesia Care Recommendation:           - Discharge patient to home (ambulatory).                           - Resume previous diet.                           - Alcohol and smoking avoidance.                           - Will refer to interventional radiology for  evaluation of possible BRTO. Patient not a                            candidate for BB as he is bradycardic at baseline. Procedure Code(s):        --- Professional ---                           [phone], Esophagogastroduodenoscopy, flexible,                            transoral; diagnostic, including collection of                            specimen(s) by brushing or washing, when performed                            (separate procedure) Diagnosis Code(s):        --- Professional ---                           I86.4, Gastric varices CPT copyright 2022 American Medical Association. All rights reserved. The codes documented in this report are preliminary and upon coder review may  be revised to meet current compliance  requirements. [name], MD [name],  08/24/2023 9:22:46 AM This report has been signed electronically. Number of Addenda: 0

## 2023-08-25 NOTE — Anesthesia Postprocedure Evaluation (Signed)
 Anesthesia Post Note  Patient: Noah Griffin  Procedure(s) Performed: EGD (ESOPHAGOGASTRODUODENOSCOPY)  Patient location during evaluation: Phase II Anesthesia Type: General Level of consciousness: awake Pain management: pain level controlled Vital Signs Assessment: post-procedure vital signs reviewed and stable Respiratory status: spontaneous breathing and respiratory function stable Cardiovascular status: blood pressure returned to baseline and stable Postop Assessment: no headache and no apparent nausea or vomiting Anesthetic complications: no Comments: Late entry   No notable events documented.   Last Vitals:  Vitals:   08/24/23 0913 08/24/23 0918  BP: 117/77 108/80  Pulse: (!) 40 (!) 44  Resp: 15 16  Temp: 36.7 C   SpO2: 100% 100%    Last Pain:  Vitals:   08/24/23 0913  TempSrc: Axillary  PainSc: 0-No pain                 [name]

## 2023-08-27 ENCOUNTER — Encounter (HOSPITAL_COMMUNITY): Payer: Self-pay | Admitting: Gastroenterology

## 2023-08-27 ENCOUNTER — Telehealth (INDEPENDENT_AMBULATORY_CARE_PROVIDER_SITE_OTHER): Payer: Self-pay | Admitting: Gastroenterology

## 2023-08-27 NOTE — Telephone Encounter (Signed)
 Referral sent, they will contact patient with apt

## 2023-08-27 NOTE — Telephone Encounter (Signed)
 Hi Ann, can you please refer the patient to interventional radiology for BRTO consideration? Dx: Gastric varices  Thanks,   Toribio Fortune, MD Gastroenterology and Hepatology [facility]

## 2023-08-31 ENCOUNTER — Other Ambulatory Visit: Payer: Self-pay | Admitting: Gastroenterology

## 2023-08-31 DIAGNOSIS — I864 Gastric varices: Secondary | ICD-10-CM

## 2023-09-26 ENCOUNTER — Telehealth (INDEPENDENT_AMBULATORY_CARE_PROVIDER_SITE_OTHER): Payer: Self-pay | Admitting: Gastroenterology

## 2023-09-26 NOTE — Telephone Encounter (Signed)
 Send Delon at [employer] a message and she states did call patient and left him a message. He never called back. I will give him another call. Thank you!

## 2023-09-26 NOTE — Telephone Encounter (Signed)
 Pt left voicemail stating he had an EGD on 6/20 with Dr.Castaneda and he was told that he would be referred to Interventional Radiology. Pt states it has been a month and he has not heard anything. Pt is wanting to know when can he expect to hear from IR. Pt also wanted to know how dangerous is situation/varices are. Please advise. Thank you!

## 2023-09-26 NOTE — Telephone Encounter (Signed)
 Jenkins, can you please check on the status of this referral? It was sent on 6/23 Thanks

## 2023-09-26 NOTE — Telephone Encounter (Signed)
 Thank you :)

## 2023-10-03 ENCOUNTER — Ambulatory Visit
Admission: RE | Admit: 2023-10-03 | Discharge: 2023-10-03 | Disposition: A | Discharge status: Home / Self Care | Source: Ambulatory Visit | Attending: Gastroenterology | Admitting: Gastroenterology

## 2023-10-03 DIAGNOSIS — I864 Gastric varices: Secondary | ICD-10-CM

## 2023-10-03 HISTORY — PX: IR RADIOLOGIST EVAL & MGMT: IMG5224

## 2023-10-03 NOTE — Consult Note (Signed)
 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 11/18/2007 CT showed 5 x 4 cm hypoattenuating almost cystic mass identified in the tail of the pancreas. This is associated peri pancreatic edema/infiltration and and increased number of lymph nodes in the peripancreatic tissues. Imaging could well be secondary to pancreatic neoplasm, but pancreatic inflammatory change with parenchymal pseudocyst formation could also have this appearance.   Diffuse fatty infiltration of the liver.  Probable occlusion of the splenic vein with peri gastric collateralization.  02/19/2008 CT Smaller pancreatic body/tail cystic lesion with peripancreatic residual edema/inflammation. Findings are consistent with improvement in the parenchymal pseudocyst and subacute pancreatitis. Residual fatty infiltration of the liver.  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  04/13/23 CT at presentation of acute abdominal pain: Complete pancreatic atrophy. And extensive gastrosplenic, perigastric, and to a lesser extent Mesenteric Venous Varices. Superimposed confluent Mesenteric Edema affecting a large part of the Small Bowel mesentery. Mid small bowel loops contain fluid, while both proximal and distal loops are decompressed. Closed loop obstruction and internal hernia were both considered, but no abrupt transition or abnormal swirling of the mesentery are identified. And no acute mesenteric  vascular occlusion is identified.  SYmptoms resolved with observation.  08/24/23 Type 1 isolated gastric varices ( IGV1, varices located in the fundus) with no bleeding were found on the greater curvature of the stomach. There were no stigmata of recent bleeding. They were medium in largest diameter.  No h/o hematemesis. No abd pain or tenderness.   Past Medical History:  Diagnosis Date   Anxiety    Esophageal varices (HCC)    Hypertension    Pancreatitis    Thyroid condition     Past Surgical History:  Procedure Laterality Date   COLONOSCOPY N/A 11/20/2013   Procedure: COLONOSCOPY;  Surgeon: Claudis RAYMOND Rivet, MD;  Location: [facility];  Service: Endoscopy;  Laterality: N/A;  730-rescheduled same time Ann notified pt   ESOPHAGOGASTRODUODENOSCOPY N/A 08/24/2023   Procedure: EGD (ESOPHAGOGASTRODUODENOSCOPY);  Surgeon: Eartha Angelia, Sieving, MD;  Location: [facility];  Service: Gastroenterology;  Laterality: N/A;  900AM, ASA 1-2   HERNIA REPAIR      Allergies: Patient has no known allergies.  Medications: Prior to Admission medications   Medication Sig Start Date End Date Taking? Authorizing Provider  Ascorbic Acid (VITAMIN C) 1000 MG tablet Take 1,000 mg by mouth daily.    [provider]  ciclopirox (PENLAC) 8 % solution Apply topically at bedtime. Apply over nail and surrounding skin. Apply daily over previous coat. After seven (7) days, may remove with alcohol and continue cycle.    [provider]  clonazePAM (KLONOPIN) 0.5 MG tablet Take 0.5 mg by mouth at bedtime.    [provider]  escitalopram (LEXAPRO) 20 MG tablet Take 20 mg by mouth daily.    [provider]  Levothyroxine Sodium 200 MCG/ML SOLN Take 250 mcg by mouth daily before breakfast.    [provider]  OVER THE COUNTER MEDICATION Vit D daily Magnesium 200 mg daily    [provider]  pantoprazole (PROTONIX)  40 MG tablet Take 40 mg by mouth daily.     [provider]  traZODone (DESYREL) 50 MG tablet Take 100 mg by mouth at bedtime.     [provider]  valsartan (DIOVAN) 160 MG tablet Take 160 mg by mouth daily.    [provider]     Family History  Problem Relation Age of Onset   Parkinsonism Mother    Pancreatic cancer Father    Healthy Son     Social History   Socioeconomic History   Marital status: Married    Spouse name: Not on file   Number of children: Not on file   Years of education: Not on file   Highest education level: Not on file  Occupational History   Not on file  Tobacco Use   Smoking status: Former    Types: Cigars   Smokeless tobacco: Never   Tobacco comments:    patient smokes 1 cigar a day  Vaping Use   Vaping status: Never Used  Substance and Sexual Activity   Alcohol use: No    Comment: Quit using alcohol 3-4 years ago following acute Pancreatitis Attack   Drug use: No   Sexual activity: Not on file  Other Topics Concern   Not on file  Social History Narrative   Not on file   Social Drivers of Health   Financial Resource Strain: Low Risk  (06/06/2023)   [facility]   Overall Financial Resource Strain (CARDIA)    Difficulty of Paying Living Expenses: Not very hard  Food Insecurity: No Food Insecurity (06/06/2023)   Received from [facility]   Hunger Vital Sign    Within the past 12 months, you worried that your food would run out before you got the money to buy more.: Never true    Within the past 12 months, the food you bought just didn't last and you didn't have money to get more.: Never true  Transportation Needs: No Transportation Needs (06/06/2023)   Received from [facility]   PRAPARE - Transportation    Lack of Transportation (Medical): No    Lack of Transportation (Non-Medical): No  Physical Activity: Not on file  Stress: Not on file  Social Connections: Not on file    ECOG Status: 0 - Asymptomatic  Review of Systems: A 12  point ROS discussed and pertinent positives are indicated in the HPI above.  All other systems are negative.  Review of Systems  Vital Signs: BP 137/75 (BP Location: Left Arm, Patient Position: Sitting, Cuff Size: Normal)   Pulse (!) 45   Temp 98.2 F (36.8 C) (Oral)   Physical Exam Constitutional: Oriented to person, place, and time. Well-developed and well-nourished. No distress.   HENT:  Head: Normocephalic and atraumatic.  Eyes: Conjunctivae and EOM are normal. Right eye exhibits no discharge. Left eye exhibits no discharge. No scleral icterus.  Neck: No JVD present.  Pulmonary/Chest: Effort normal. No stridor. No respiratory distress.  Abdomen: soft, non distended Neurological:  alert and oriented to person, place, and time.  Skin: Skin is warm and dry.  not diaphoretic.  Psychiatric:   normal mood and affect.   behavior is normal. Judgment and thought content normal.       Imaging: CT ABDOMEN AND PELVIS WITH CONTRAST  TECHNIQUE: Multidetector CT imaging of the abdomen and pelvis was performed using the standard protocol following bolus administration of intravenous contrast.  RADIATION DOSE REDUCTION: This exam was  performed according to the departmental dose-optimization program which includes automated exposure control, adjustment of the mA and/or kV according to patient size and/or use of iterative reconstruction technique.  CONTRAST: 80 mL Omnipaque 350  COMPARISON: Report of CT Abdomen and Pelvis 11/18/2007 and 10/15/2009 (no images available).  FINDINGS: Lower chest: Lung bases are clear except for mild posterior basal segment left lower lobe scarring or atelectasis. Normal heart size. No pericardial or pleural effusion.  Hepatobiliary: Liver and gallbladder remain within normal limits. Bile ducts are at the upper limits of normal.  Pancreas: Completely atrophied, virtually no pancreatic parenchyma can be identified.  Spleen: Gastrosplenic varices.  But splenic size remains within normal limits. No perisplenic fluid.  Adrenals/Urinary Tract: Normal adrenal glands. Nonobstructed kidneys with symmetric renal enhancement and contrast excretion. Nondilated ureters. Unremarkable bladder. Incidental pelvic phleboliths.  Stomach/Bowel: Moderate large bowel retained stool throughout. No large bowel wall thickening is identified. The appendix is not delineated.  Extensive but widespread confluent soft tissue edema throughout the small bowel mesentery, especially in the lower abdomen. See coronal image 86 and series 2, image 49. This seems to abruptly spare the large bowel mesentery. Underlying fluid containing but nondilated small bowel loops. Distal small bowel is decompressed. No abrupt transition is identified. No abnormal swirling of the mesentery.  But the proximal jejunum is also decompressed and the mesentery there does not appear as edematous (coronal image 71 and series 2, image 20).  Extensive perigastric varices. Liver and duodenum otherwise within normal limits.  No free air identified. No discrete free fluid.  Vascular/Lymphatic: Extensive enhancing gastrosplenic and perigastric venous varices (series 2, image 13). And the underlying splenic vein is highly diminutive (coronal image 92). The portal venous system otherwise is patent. And there are smaller caliber omental venous varices.  Major arterial structures in the abdomen and pelvis appear patent with mild aortoiliac tortuosity. Mild-to-moderate Aortoiliac calcified atherosclerosis. No acute mesenteric vessel occlusion is identified.  Central venous structures also appear to be patent. No lymphadenopathy identified.  Reproductive: Negative.  Other: No pelvis free fluid.  Musculoskeletal: Age indeterminate although probably chronic mild L4 superior endplate compression fracture. This designates absent ribs at T12. Underlying osteopenia. No other acute  osseous abnormality identified.  IMPRESSION: 1. 2009/2011 history of pancreatitis and splenic vein thrombosis in this patient. Complete pancreatic atrophy. And extensive gastrosplenic, perigastric, and to a lesser extent Mesenteric Venous Varices.  2. Superimposed confluent Mesenteric Edema affecting a large part of the Small Bowel mesentery. Mid small bowel loops contain fluid, while both proximal and distal loops are decompressed. Closed loop obstruction and internal hernia were both considered, but no abrupt transition or abnormal swirling of the mesentery are identified. And no acute mesenteric vascular occlusion is identified. No free air or free fluid. Correlate with serum lactate.  3. No large bowel inflammation, appendix is not identified. Large bowel retained stool.  4. Age indeterminate but probably chronic mild L4 superior endplate compression fracture.  5. Aortic Atherosclerosis (ICD10-I70.0).   Electronically Signed By: VEAR Hurst M.D. On: 04/13/2023 04:55   Labs:  CBC: No results for input(s): WBC, HGB, HCT, PLT in the last 8760 hours.  COAGS: No results for input(s): INR, APTT in the last 8760 hours.  BMP: No results for input(s): NA, K, CL, CO2, GLUCOSE, BUN, CALCIUM, CREATININE, GFRNONAA, GFRAA in the last 8760 hours.  Invalid input(s): CMP  LIVER FUNCTION TESTS: No results for input(s): BILITOT, AST, ALT, ALKPHOS, PROT, ALBUMIN in the last 8760 hours.  TUMOR MARKERS: No  results for input(s): AFPTM, CEA, CA199, CHROMGRNA in the last 8760 hours.  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  These result in bleeding risk. Small bowel venous collaterals provide additional spenic venous collateral outflow.  We discussed the anatomic changes, and risk of catastrophic  GI bleed. We discussed options including watchful waiting and embolization. We discussed transvenous and transsplenic approaches to the spenic outflow and gastric varices. We discussed possiblity of incomplete occlusion of the gastric varices. He seemed to understand, and was agreable to proceed.  Accordingly, we can set him up for balloon retrograde transcatheter occlusion of gastric varices, possible transsplenic approach if no significant splenorenal shunt can be accessed. We can set this up as an outpatient as his convenience.    Thank you for this interesting consult.  I greatly enjoyed meeting Noah Griffin and look forward to participating in their care.  A copy of this report was sent to the requesting provider on this date.  Electronically Signed: Dayne Alida Greiner 10/03/2023, 9:46 AM   I spent a total of  40 Minutes   in face to face in clinical consultation, greater than 50% of which was counseling/coordinating care for gastric varices.

## 2023-10-17 ENCOUNTER — Encounter (INDEPENDENT_AMBULATORY_CARE_PROVIDER_SITE_OTHER): Payer: Self-pay | Admitting: *Deleted

## 2023-10-17 ENCOUNTER — Other Ambulatory Visit (HOSPITAL_COMMUNITY): Payer: Self-pay | Admitting: Interventional Radiology

## 2023-10-17 DIAGNOSIS — I864 Gastric varices: Secondary | ICD-10-CM

## 2023-10-25 ENCOUNTER — Other Ambulatory Visit: Payer: Self-pay | Admitting: Radiology

## 2023-10-25 ENCOUNTER — Telehealth: Payer: Self-pay | Admitting: *Deleted

## 2023-10-25 ENCOUNTER — Other Ambulatory Visit: Payer: Self-pay | Admitting: Student

## 2023-10-25 DIAGNOSIS — I10 Essential (primary) hypertension: Secondary | ICD-10-CM

## 2023-10-25 NOTE — Telephone Encounter (Signed)
 I understand. It would be ok to wait until this fall, but would discourage waiting longer than that Thanks

## 2023-10-25 NOTE — Telephone Encounter (Signed)
 Pt said he received a questionnaire regarding his colonoscopy. He states he will be having stomach varices removed tomorrow 10/26/23. He states that there will be multiple surgeries and investigations going on with him. He was to postpone his colonoscopy until next fall or the following fall, if that's ok. Please advise. Thank you

## 2023-10-26 ENCOUNTER — Encounter (HOSPITAL_COMMUNITY): Payer: Self-pay

## 2023-10-26 ENCOUNTER — Other Ambulatory Visit (HOSPITAL_COMMUNITY): Payer: Self-pay | Admitting: Interventional Radiology

## 2023-10-26 ENCOUNTER — Ambulatory Visit (HOSPITAL_COMMUNITY)
Admission: RE | Admit: 2023-10-26 | Discharge: 2023-10-26 | Disposition: A | Discharge status: Home / Self Care | Source: Ambulatory Visit | Attending: Interventional Radiology | Admitting: Interventional Radiology

## 2023-10-26 ENCOUNTER — Other Ambulatory Visit: Payer: Self-pay

## 2023-10-26 DIAGNOSIS — I864 Gastric varices: Secondary | ICD-10-CM

## 2023-10-26 DIAGNOSIS — I10 Essential (primary) hypertension: Secondary | ICD-10-CM

## 2023-10-26 HISTORY — PX: IR US GUIDE VASC ACCESS RIGHT: IMG2390

## 2023-10-26 HISTORY — PX: IR EMBO ART  VEN HEMORR LYMPH EXTRAV  INC GUIDE ROADMAPPING: IMG5450

## 2023-10-26 HISTORY — PX: IR VENOGRAM RENAL UNI LEFT: IMG680

## 2023-10-26 LAB — CBC
HCT: 33.2 % — ABNORMAL LOW (ref 39.0–52.0)
Hemoglobin: 11.1 g/dL — ABNORMAL LOW (ref 13.0–17.0)
MCH: 32 pg (ref 26.0–34.0)
MCHC: 33.4 g/dL (ref 30.0–36.0)
MCV: 95.7 fL (ref 80.0–100.0)
Platelets: 122 K/uL — ABNORMAL LOW (ref 150–400)
RBC: 3.47 MIL/uL — ABNORMAL LOW (ref 4.22–5.81)
RDW: 12.1 % (ref 11.5–15.5)
WBC: 4.9 K/uL (ref 4.0–10.5)
nRBC: 0 % (ref 0.0–0.2)

## 2023-10-26 LAB — BASIC METABOLIC PANEL WITH GFR
Anion gap: 6 (ref 5–15)
BUN: 10 mg/dL (ref 8–23)
CO2: 26 mmol/L (ref 22–32)
Calcium: 8.9 mg/dL (ref 8.9–10.3)
Chloride: 105 mmol/L (ref 98–111)
Creatinine, Ser: 1.11 mg/dL (ref 0.61–1.24)
GFR, Estimated: >60 mL/min (ref >60)
Glucose, Bld: 119 mg/dL — ABNORMAL HIGH (ref 70–99)
Potassium: 4.2 mmol/L (ref 3.5–5.1)
Sodium: 137 mmol/L (ref 135–145)

## 2023-10-26 LAB — PROTIME-INR
INR: 1 (ref 0.8–1.2)
Prothrombin Time: 13.5 s (ref 11.4–15.2)

## 2023-10-26 MED ORDER — FENTANYL CITRATE (PF) 100 MCG/2ML IJ SOLN
INTRAMUSCULAR | Status: AC | PRN
  Administered 2023-10-26 (×4): 50 ug via INTRAVENOUS

## 2023-10-26 MED ORDER — SODIUM CHLORIDE 0.9 % IV SOLN: INTRAVENOUS | Status: DC

## 2023-10-26 MED ORDER — MIDAZOLAM HCL 2 MG/2ML IJ SOLN
INTRAMUSCULAR | Status: AC | PRN
  Administered 2023-10-26 (×4): 1 mg via INTRAVENOUS

## 2023-10-26 MED ORDER — HEPARIN SODIUM (PORCINE) 1000 UNIT/ML IJ SOLN
INTRAMUSCULAR | Status: AC
  Filled 2023-10-26: qty 10

## 2023-10-26 MED ORDER — MIDAZOLAM HCL 2 MG/2ML IJ SOLN
INTRAMUSCULAR | Status: AC
  Filled 2023-10-26: qty 2

## 2023-10-26 MED ORDER — FENTANYL CITRATE (PF) 100 MCG/2ML IJ SOLN
INTRAMUSCULAR | Status: AC
  Filled 2023-10-26: qty 2

## 2023-10-26 MED ORDER — LIDOCAINE HCL 1 % IJ SOLN
INTRAMUSCULAR | Status: AC
Start: 2023-10-26 — End: 2023-10-26
  Filled 2023-10-26: qty 20

## 2023-10-26 MED ORDER — HYDROCODONE-ACETAMINOPHEN 5-325 MG PO TABS: 1.0000 | ORAL_TABLET | ORAL | Status: DC | PRN

## 2023-10-26 MED ORDER — IOHEXOL 300 MG/ML  SOLN
150.0000 mL | Freq: Once | INTRAMUSCULAR | Status: AC | PRN
  Administered 2023-10-26: 45 mL via INTRAVENOUS

## 2023-10-26 NOTE — Telephone Encounter (Signed)
 Pt's spouse [place] informed of providers message and recommendations. Stated she would let pt know.

## 2023-10-26 NOTE — Procedures (Signed)
  Procedure:  Left renal venogram no communication to gastric varices identified to allow BRTO Preprocedure diagnosis: Diagnoses of Gastric varices and Hypertension, unspecified type were pertinent to this visit. Postprocedure diagnosis: same EBL:    minimal Complications:   none immediate  See full dictation in [employer].  CHARM Toribio Faes MD Main # [phone] Pager  [phone] Mobile [phone]

## 2023-10-26 NOTE — Consult Note (Signed)
 Chief Complaint: Patient was seen in consultation today for gastric varices  Referring Physician(s): Dr. Toribio Parkin Griffin  Supervising Physician: Johann Toribio  Patient Status: [facility] - Out-pt  History of Present Illness: Noah Griffin is a 68 y.o. 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  Patient has been followed by GI with for surveillance of his gastric varices and until recently showed stability vs. Slow enlargement.  Most CT imaging in February 2025 showed significant enlargement of his varices prompting referral to Interventional Radiology.  Patient met in consultation with Dr. Johann 10/03/23 at which time options including embolization were discussed.  Patient elected to proceed and presents for procedure today.   Noah Griffin presents to [facility] Radiology today in his usual state of health.  He has been NPO.  He does not take blood thinners.  He has questions about the procedure which are answered.  He is aware of the goals, risks, and benefits, and is agreeable to proceed.   His wife, [place], will be available for the procedure today.   Patient is FULL code for procedural purposes today.   Past Medical History:  Diagnosis Date   Anxiety    Esophageal varices (HCC)    Hypertension    Pancreatitis    Thyroid condition     Past Surgical History:  Procedure Laterality Date   COLONOSCOPY N/A 11/20/2013   Procedure: COLONOSCOPY;  Surgeon: Claudis RAYMOND Rivet, MD;  [facility];  Service: Endoscopy;  Laterality: N/A;  730-rescheduled same time Ann notified pt   ESOPHAGOGASTRODUODENOSCOPY N/A 08/24/2023   Procedure: EGD (ESOPHAGOGASTRODUODENOSCOPY);  Surgeon: Eartha Flavors, Toribio, MD;  Location: [facility];  Service: Gastroenterology;  Laterality: N/A;  900AM, ASA 1-2   HERNIA REPAIR     IR RADIOLOGIST EVAL & MGMT  10/03/2023    Allergies: Patient has no known allergies.  Medications: Prior  to Admission medications   Medication Sig Start Date End Date Taking? Authorizing Provider  amLODipine (NORVASC) 10 MG tablet Take 10 mg by mouth 2 (two) times daily.   Yes [provider]  Ascorbic Acid (VITAMIN C) 1000 MG tablet Take 1,000 mg by mouth daily.   Yes [provider]  ciclopirox (PENLAC) 8 % solution Apply topically at bedtime. Apply over nail and surrounding skin. Apply daily over previous coat. After seven (7) days, may remove with alcohol and continue cycle.   Yes [provider]  clonazePAM (KLONOPIN) 0.5 MG tablet Take 0.5 mg by mouth at bedtime.   Yes [provider]  escitalopram (LEXAPRO) 20 MG tablet Take 20 mg by mouth daily.   Yes [provider]  Levothyroxine Sodium 200 MCG/ML SOLN Take 250 mcg by mouth daily before breakfast.   Yes [provider]  OVER THE COUNTER MEDICATION Vit D daily Magnesium 200 mg daily   Yes [provider]  pantoprazole (PROTONIX) 40 MG tablet Take 40 mg by mouth daily.   Yes [provider]  traZODone (DESYREL) 50 MG tablet Take 100 mg by mouth at bedtime.    Yes [provider]  valsartan (DIOVAN) 160 MG tablet Take 160 mg by mouth daily.   Yes [provider]     Family History  Problem Relation Age of Onset   Parkinsonism Mother    Pancreatic cancer Father    Healthy Son     Social History   Socioeconomic History   Marital status: Married    Spouse  name: Not on file   Number of children: Not on file   Years of education: Not on file   Highest education level: Not on file  Occupational History   Not on file  Tobacco Use   Smoking status: Former    Types: Cigars   Smokeless tobacco: Never   Tobacco comments:    patient smokes 1 cigar a day  Vaping Use   Vaping status: Never Used  Substance and Sexual Activity   Alcohol use: No    Comment: Quit using alcohol 3-4 years ago following acute Pancreatitis Attack   Drug use: No    Sexual activity: Not on file  Other Topics Concern   Not on file  Social History Narrative   Not on file   Social Drivers of Health   Financial Resource Strain: Low Risk  (06/06/2023)   Received from [facility]   Overall Financial Resource Strain (CARDIA)    Difficulty of Paying Living Expenses: Not very hard  Food Insecurity: No Food Insecurity (06/06/2023)   Received from [facility]   Hunger Vital Sign    Within the past 12 months, you worried that your food would run out before you got the money to buy more.: Never true    Within the past 12 months, the food you bought just didn't last and you didn't have money to get more.: Never true  Transportation Needs: No Transportation Needs (06/06/2023)   Received from [facility]   PRAPARE - Transportation    Lack of Transportation (Medical): No    Lack of Transportation (Non-Medical): No  Physical Activity: Not on file  Stress: Not on file  Social Connections: Not on file     Review of Systems: A 12 point ROS discussed and pertinent positives are indicated in the HPI above.  All other systems are negative.  Review of Systems  Constitutional:  Negative for fatigue and fever.  Respiratory:  Negative for cough and shortness of breath.   Cardiovascular:  Negative for chest pain.  Gastrointestinal:  Negative for abdominal pain, nausea and vomiting.  Musculoskeletal:  Negative for back pain.  Psychiatric/Behavioral:  Negative for behavioral problems and confusion.     Vital Signs: BP (!) 155/77   Pulse (!) 45   Temp 98 F (36.7 C) (Oral)   Resp 16   Ht 5' 10 (1.778 m)   Wt 151 lb (68.5 kg)   SpO2 99%   BMI 21.67 kg/m   Physical Exam Vitals and nursing note reviewed.  Constitutional:      General: He is not in acute distress.    Appearance: Normal appearance. He is not ill-appearing.  HENT:     Mouth/Throat:     Mouth: Mucous membranes are moist.     Pharynx: Oropharynx is clear.  Cardiovascular:     Rate  and Rhythm: Normal rate and regular rhythm.  Pulmonary:     Effort: Pulmonary effort is normal. No respiratory distress.     Breath sounds: Normal breath sounds.  Abdominal:     General: Abdomen is flat. There is no distension.     Palpations: Abdomen is soft.  Skin:    General: Skin is warm and dry.  Neurological:     General: No focal deficit present.     Mental Status: He is alert and oriented to person, place, and time. Mental status is at baseline.  Psychiatric:        Mood and Affect: Mood normal.  Behavior: Behavior normal.        Thought Content: Thought content normal.        Judgment: Judgment normal.      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 This is associated peri pancreatic edema/infiltration and and increased number of lymph nodes in the peripancreatic tissues. Imaging could well be secondary to pancreatic neoplasm, but pancreatic inflammatory change with parenchymal pseudocyst formation could also have this appearance. Diffuse fatty infiltration of the liver. Probable occlusion of the splenic vein with peri gastric collateralization. 02/19/2008 CT Smaller pancreatic body/tail cystic lesion with peripancreatic residual edema/inflammation. Findings are consistent with improvement in the parenchymal pseudocyst and subacute  pancreatitis. Residual fatty infiltration of the liver. 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 04/13/23 CT at presentation of acute abdominal pain: Complete pancreatic atrophy. And extensive gastrosplenic, perigastric, and to a lesser extent Mesenteric Venous Varices. Superimposed confluent Mesenteric Edema affecting a large part of the Small Bowel mesentery. Mid small bowel loops contain fluid, while both proximal and distal loops are decompressed. Closed loop obstruction and internal hernia were both considered, but no abrupt transition or abnormal swirling of the mesentery are identified. And no acute mesenteric vascular occlusion is identified. SYmptoms resolved with observation.08/24/23 Type 1 isolated gastric varices ( IGV1, varices located in the fundus) with no bleeding were found on the greater curvature of the stomach. There were no stigmata of recent bleeding. They were medium in largest diameter.No h/o hematemesis. No abd pain or tenderness. Past Medical History: Diagnosis * : Date . * : Anxiety * : . * : Esophageal varices (HCC) * : . * : Hypertension * : . * : Pancreatitis * : . * : Thyroid condition * : Past Surgical History: Procedure * : Laterality * : Date . * : COLONOSCOPY * : N/A * : 11/20/2013 * : Procedure: COLONOSCOPY; Surgeon: Claudis RAYMOND Rivet, MD; Location[facility]; Service: Endoscopy; Laterality: N/A; 730-rescheduled same time Ann notified pt . * : ESOPHAGOGASTRODUODENOSCOPY * : N/A * : 08/24/2023 * : Procedure: EGD (ESOPHAGOGASTRODUODENOSCOPY); Surgeon: Eartha Flavors, Toribio, MD; Location: [facility]; Service: Gastroenterology; Laterality: N/A; 900AM, ASA 1-2 . * : HERNIA REPAIR * : * : Allergies:Patient has no known allergies.Medications: Prior to Admission medications Medication * : Sig * : Start Date * :  End Date * : Taking? * : Authorizing Provider Ascorbic Acid (VITAMIN C) 1000 MG tablet * : Take 1,000 mg by mouth daily. * : * : * : * : [provider] ciclopirox (PENLAC) 8 % solution * : Apply topically at bedtime. Apply over nail and surrounding skin. Apply daily over previous coat. After seven (7) days, may remove with alcohol and continue cycle. * : * : * : * : [provider] clonazePAM (KLONOPIN) 0.5 MG tablet * : Take 0.5 mg by mouth at bedtime. * : * : * : * :  [provider] escitalopram (LEXAPRO) 20 MG tablet * : Take 20 mg by mouth daily. * : * : * : * : [provider] Levothyroxine Sodium 200 MCG/ML SOLN * : Take 250 mcg by mouth daily before breakfast. * : * : * : * : [provider] OVER THE COUNTER MEDICATION * : Vit D dailyMagnesium 200 mg daily * : * : * : * : [provider] pantoprazole (PROTONIX) 40 MG tablet * : Take 40 mg by mouth daily. * : * : * : * : [provider] traZODone (DESYREL) 50 MG tablet * : Take 100 mg by mouth at bedtime. * : * : * : * : [provider] valsartan (DIOVAN) 160 MG tablet * : Take 160 mg by mouth daily. * : * : * : * : [provider] Family History Problem * : Relation * : Age of Onset . * : Parkinsonism * : Mother * : . * : Pancreatic cancer * : Father * : . * : Healthy * : Son * : Socioeconomic History . * : Marital status: * : Married * : * : Spouse name: * : Not on file . * : Number of children: * : Not on file . * : Years of education: * : Not on file . * : Highest education level: * : Not on file Occupational History . * : Not on file Tobacco Use . * : Smoking status: * : Former * : * : Types: * : Cigars . * : Smokeless tobacco: * : Never . * : Tobacco comments: * : * : patient smokes 1 cigar a day Vaping Use . * : Vaping status: * : Never Used Substance and Sexual Activity . * : Alcohol use: * : No * : * : Comment: Quit using alcohol 3-4 years ago following acute  Pancreatitis Attack . * : Drug use: * : No . * : Sexual activity: * : Not on file Other Topics * : Concern . * : Not on file Social History Narrative . * : Not on file Financial Resource Strain: Low Risk  (06/06/2023) * : Received from [facility] * : Overall Financial Resource Strain (CARDIA) * : . * : Difficulty of Paying Living Expenses: Not very hard Food Insecurity: No Food Insecurity (06/06/2023) * : Received from [facility] * : Hunger Vital Sign * : . * : Within the past 12 months, you worried that your food would run out before you got the money to buy more.: Never true * : . * : Within the past 12 months, the food you bought just didn't last and you didn't have money to get more.: Never true Transportation Needs: No Transportation Needs (06/06/2023) * : Received from [facility] * : PRAPARE - Transportation * : . * : Lack of Transportation (Medical): No * : . * : Lack of Transportation (Non-Medical): No Physical Activity: Not on file Stress: Not on file Social Connections: Not on file ECOG Status:0 - AsymptomaticReview of Systems: A 12 point ROS discussed and pertinent positives are indicated in the HPI above. All other systems are negative.Review of SystemsVital Signs:BP 137/75 (BP Location: Left Arm, Patient Position: Sitting, Cuff Size: Normal)  Pulse (!) 45  Temp 98.2 F (36.8 C) (Oral) Physical ExamConstitutional: Oriented to person, place, and time. Well-developed and well-nourished. No distress. ? HENT: Head: Normocephalic and atraumatic. Eyes: Conjunctivae and EOM  are normal. Right eye exhibits no discharge. Left eye exhibits no discharge. No scleral icterus. Neck: No JVD present. Pulmonary/Chest: Effort normal. No stridor. No respiratory distress. Abdomen: soft, non distendedNeurological: alert and oriented to person, place, and time. Skin: Skin is warm and dry. not diaphoretic. Psychiatric: normal mood and affect. behavior is normal. Judgment and thought content normal. Imaging:CT ABDOMEN  AND PELVIS WITH CONTRAST TECHNIQUE: Multidetector CT imaging of the abdomen and pelvis was performed using the standard protocol following bolus administration of intravenous contrast. RADIATION DOSE REDUCTION: This exam was performed according to the departmental dose-optimization program which includes automated exposure control, adjustment of the mA and/or kV according to patient size and/or use of iterative reconstruction technique. CONTRAST: 80 mL Omnipaque  350 COMPARISON: Report of CT Abdomen and Pelvis 11/18/2007 and 10/15/2009 (no images available). FINDINGS: Lower chest: Lung bases are clear except for mild posterior basal segment left lower lobe scarring or atelectasis. Normal heart size. No pericardial or pleural effusion. Hepatobiliary: Liver and gallbladder remain within normal limits. Bile ducts are at the upper limits of normal. Pancreas: Completely atrophied, virtually no pancreatic parenchyma can be identified. Spleen: Gastrosplenic varices. But splenic size remains within normal limits. No perisplenic fluid. Adrenals/Urinary Tract: Normal adrenal glands. Nonobstructed kidneys with symmetric renal enhancement and contrast excretion. Nondilated ureters. Unremarkable bladder. Incidental pelvic phleboliths. Stomach/Bowel: Moderate large bowel retained stool throughout. No large bowel wall thickening is identified. The appendix is not delineated. Extensive but widespread confluent soft tissue edema throughout the small bowel mesentery, especially in the lower abdomen. See coronal image 86 and series 2, image 49. This seems to abruptly spare the large bowel mesentery. Underlying fluid containing but nondilated small bowel loops. Distal small bowel is decompressed. No abrupt transition is identified. No abnormal swirling of the mesentery. But the proximal jejunum is also decompressed and the mesentery there does not appear as edematous (coronal image 71 and series 2, image 20). Extensive perigastric  varices. Liver and duodenum otherwise within normal limits. No free air identified. No discrete free fluid. Vascular/Lymphatic: Extensive enhancing gastrosplenic and perigastric venous varices (series 2, image 13). And the underlying splenic vein is highly diminutive (coronal image 92). The portal venous system otherwise is patent. And there are smaller caliber omental venous varices. Major arterial structures in the abdomen and pelvis appear patent with mild aortoiliac tortuosity. Mild-to-moderate Aortoiliac calcified atherosclerosis. No acute mesenteric vessel occlusion is identified. Central venous structures also appear to be patent. No lymphadenopathy identified. Reproductive: Negative. Other: No pelvis free fluid. Musculoskeletal: Age indeterminate although probably chronic mild L4 superior endplate compression fracture. This designates absent ribs at T12. Underlying osteopenia. No other acute osseous abnormality identified. IMPRESSION: 1. 2009/2011 history of pancreatitis and splenic vein thrombosis in this patient. Complete pancreatic atrophy. And extensive gastrosplenic, perigastric, and to a lesser extent Mesenteric Venous Varices. 2. Superimposed confluent Mesenteric Edema affecting a large part of the Small Bowel mesentery. Mid small bowel loops contain fluid, while both proximal and distal loops are decompressed. Closed loop obstruction and internal hernia were both considered, but no abrupt transition or abnormal swirling of the mesentery are identified. And no acute mesenteric vascular occlusion is identified. No free air or free fluid. Correlate with serum lactate. 3. No large bowel inflammation, appendix is not identified. Large bowel retained stool. 4. Age indeterminate but probably chronic mild L4 superior endplate compression fracture. 5. Aortic Atherosclerosis (ICD10-I70.0). Electronically Signed By: VEAR Hurst M.D. On: 04/13/2023 04:55 Labs:CBC: No results for input(s): WBC, HGB, HCT, PLT  in  the last 8760 hours. COAGS: No results for input(s): INR, APTT in the last 8760 hours. BMP: No results for input(s): NA, K, CL, CO2, GLUCOSE, BUN, CALCIUM, CREATININE, GFRNONAA, GFRAA in the last 8760 hours. Invalid input(s): CMP LIVER FUNCTION TESTS: No results for input(s): BILITOT, AST, ALT, ALKPHOS, PROT, ALBUMIN in the last 8760 hours. TUMOR MARKERS: No results for input(s): AFPTM, CEA, CA199, CHROMGRNA in the last 8760 hours. 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 These result in bleeding risk.Small bowel venous collaterals provide additional spenic venous collateral outflow.We discussed the anatomic changes, and risk of catastrophic GI bleed. We discussed options including watchful waiting and embolization. We discussed transvenous and transsplenic approaches to the spenic outflow and gastric varices. We discussed possiblity of incomplete occlusion of the gastric varices. He seemed to understand, and was agreable to proceed.Accordingly, we can set him up for balloon retrograde transcatheter occlusion of gastric varices, possible transsplenic approach if no significant splenorenal shunt can be accessed. We can set this up as an outpatient as his convenience.Thank you for this interesting consult. I greatly enjoyed meeting Noah Griffin and look forward to participating in their care. A copy of this report was sent to the requesting provider on this date. Electronically Signed   By: JONETTA Faes M.D.   On: 10/12/2023 07:25    Labs:  CBC: Recent Labs    10/26/23 0656  WBC 4.9  HGB 11.1*  HCT 33.2*  PLT 122*    COAGS: Recent Labs    10/26/23 0656  INR 1.0    BMP: Recent Labs    10/26/23 0656  NA 137  K 4.2  CL 105  CO2 26  GLUCOSE 119*  BUN 10  CALCIUM 8.9  CREATININE 1.11  GFRNONAA >60     LIVER FUNCTION TESTS: No results for input(s): BILITOT, AST, ALT, ALKPHOS, PROT, ALBUMIN in the last 8760 hours.  TUMOR MARKERS: No results for input(s): AFPTM, CEA, CA199, CHROMGRNA in the last 8760 hours.  Assessment and Plan: Patient with past medical history of pancreatitis, splenic vein thrombosis with enlarging gastric varices presents for gastric variceal embolization at the request of Dr. Lita. Case reviewed by Dr. Faes who approves patient for procedure.  Patient presents today in their usual state of health.  He has been NPO and is not currently on blood thinners.   The Risks and benefits of embolization were discussed with the patient including, but not limited to bleeding, infection, vascular injury, post operative pain, or contrast induced renal failure.  This procedure involves the use of X-rays and because of the nature of the planned procedure, it is possible that we will have prolonged use of X-ray fluoroscopy.  Potential radiation risks to you include (but are not limited to) the following: - A slightly elevated risk for cancer several years later in life. This risk is typically less than 0.5% percent. This risk is low in comparison to the normal incidence of human cancer, which is 33% for women and 50% for men according to the American Cancer Society. - Radiation induced injury can include skin redness, resembling a rash, tissue breakdown / ulcers and hair loss (which can be temporary or permanent).   The likelihood of either of these occurring depends on the difficulty of the procedure and whether you are sensitive to radiation due to previous procedures, disease, or genetic conditions.   IF your procedure  requires a prolonged use of radiation, you will be notified and given written instructions for further action.  It is your responsibility to monitor the irradiated area for the 2 weeks following the procedure and to notify your  physician if you are concerned that you have suffered a radiation induced injury.    All of the patient's questions were answered, patient is agreeable to proceed. Consent signed and in chart.  Advance Care Plan: The advanced care plan/surrogate decision maker was discussed at the time of visit and documented in the medical record.     Thank you for this interesting consult.  I greatly enjoyed meeting Noah Griffin and look forward to participating in their care.  A copy of this report was sent to the requesting provider on this date.  Electronically Signed: Neamiah Sciarra Sue-Ellen Jenah Vanasten, PA 10/26/2023, 8:41 AM   I spent a total of  30 Minutes   in face to face in clinical consultation, greater than 50% of which was counseling/coordinating care for gastric varices, splenic vein thrombosis.

## 2023-10-26 NOTE — Progress Notes (Signed)
 Patient and wife was given discharge instructions. Both verbalized understanding.

## 2023-11-02 ENCOUNTER — Telehealth: Payer: Self-pay | Admitting: Gastroenterology

## 2023-11-02 ENCOUNTER — Ambulatory Visit
Admission: RE | Admit: 2023-11-02 | Discharge: 2023-11-02 | Disposition: A | Discharge status: Home / Self Care | Source: Ambulatory Visit | Attending: Student | Admitting: Student

## 2023-11-02 DIAGNOSIS — I864 Gastric varices: Secondary | ICD-10-CM

## 2023-11-02 HISTORY — PX: IR RADIOLOGIST EVAL & MGMT: IMG5224

## 2023-11-02 NOTE — Progress Notes (Signed)
 Patient ID: Noah Griffin, male   DOB: [birth year], 68 y.o.   MRN: [id number]       Chief Complaint: Patient was consulted remotely today (TeleHealth) for splenic varices followup post left renal venogram at the request of Matthews,Kacie Sue-Ellen.    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 11/18/2007 CT showed  Probable occlusion of the splenic vein with peri gastric collateralization.  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  04/13/23 CT at presentation of acute abdominal pain: Complete pancreatic atrophy. And extensive gastrosplenic, perigastric, and to a lesser extent Mesenteric Venous Varices. Superimposed confluent Mesenteric Edema affecting a large part of the Small Bowel mesentery.    no acute mesenteric vascular occlusion is identified.  SYmptoms resolved with observation. 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 There were no stigmata of recent bleeding. They were medium in largest diameter.  No h/o hematemesis. No abd pain or tenderness.  10/26/23 Left renal venogram with attempted BRTO of gastric varices, however no splenorenal shunt was demonstrated so procedure terminated.  He did well post venogram, no access site issues. He remains asymptomaticc  Past Medical History:  Diagnosis Date   Anxiety    Esophageal varices (HCC)    Hypertension    Pancreatitis    Thyroid condition     Past Surgical History:  Procedure Laterality Date   COLONOSCOPY N/A 11/20/2013   Procedure:  COLONOSCOPY;  Surgeon: Claudis RAYMOND Rivet, MD;  Location: [facility];  Service: Endoscopy;  Laterality: N/A;  730-rescheduled same time Ann notified pt   ESOPHAGOGASTRODUODENOSCOPY N/A 08/24/2023   Procedure: EGD (ESOPHAGOGASTRODUODENOSCOPY);  Surgeon: Eartha Flavors, Toribio, MD;  Location: [facility];  Service: Gastroenterology;  Laterality: N/A;  900AM, ASA 1-2   HERNIA REPAIR     IR EMBO ART  VEN HEMORR LYMPH EXTRAV  INC GUIDE ROADMAPPING  10/26/2023   IR RADIOLOGIST EVAL & MGMT  10/03/2023   IR US  GUIDE VASC ACCESS RIGHT  10/26/2023   IR VENOGRAM RENAL UNI LEFT  10/26/2023    Allergies: Patient has no known allergies.  Medications: Prior to Admission medications   Medication Sig Start Date End Date Taking? Authorizing Provider  amLODipine (NORVASC) 10 MG tablet Take 10 mg by mouth 2 (two) times daily.    [provider]  Ascorbic Acid (VITAMIN C) 1000 MG tablet Take 1,000 mg by mouth daily.    [provider]  ciclopirox (PENLAC) 8 % solution Apply topically at bedtime. Apply over nail and surrounding skin. Apply daily over previous coat. After seven (7) days, may remove with alcohol and continue cycle.    [provider]  clonazePAM (KLONOPIN) 0.5 MG tablet Take 0.5 mg by mouth at bedtime.    [provider]  escitalopram (LEXAPRO) 20 MG tablet Take 20 mg by mouth daily.    [provider]  Levothyroxine Sodium 200 MCG/ML SOLN Take 250 mcg by mouth daily before breakfast.    [provider]  OVER THE COUNTER MEDICATION Vit D daily Magnesium 200 mg daily    [provider]  pantoprazole (PROTONIX)  40 MG tablet Take 40 mg by mouth daily.    [provider]  traZODone (DESYREL) 50 MG tablet Take 100 mg by mouth at bedtime.     [provider]  valsartan (DIOVAN) 160 MG tablet Take 160 mg by mouth daily.    [provider]     Family History  Problem Relation Age of Onset   Parkinsonism Mother     Pancreatic cancer Father    Healthy Son     Social History   Socioeconomic History   Marital status: Married    Spouse name: Not on file   Number of children: Not on file   Years of education: Not on file   Highest education level: Not on file  Occupational History   Not on file  Tobacco Use   Smoking status: Former    Types: Cigars   Smokeless tobacco: Never   Tobacco comments:    patient smokes 1 cigar a day  Vaping Use   Vaping status: Never Used  Substance and Sexual Activity   Alcohol use: No    Comment: Quit using alcohol 3-4 years ago following acute Pancreatitis Attack   Drug use: No   Sexual activity: Not on file  Other Topics Concern   Not on file  Social History Narrative   Not on file   Social Drivers of Health   Financial Resource Strain: Low Risk  (06/06/2023)   Received from [facility]   Overall Financial Resource Strain (CARDIA)    Difficulty of Paying Living Expenses: Not very hard  Food Insecurity: No Food Insecurity (06/06/2023)   Received from [facility]   Hunger Vital Sign    Within the past 12 months, you worried that your food would run out before you got the money to buy more.: Never true    Within the past 12 months, the food you bought just didn't last and you didn't have money to get more.: Never true  Transportation Needs: No Transportation Needs (06/06/2023)   Received from [facility]   PRAPARE - Transportation    Lack of Transportation (Medical): No    Lack of Transportation (Non-Medical): No  Physical Activity: Not on file  Stress: Not on file  Social Connections: Not on file    ECOG Status: 0 - Asymptomatic  Review of Systems  Review of Systems: A 12 point ROS discussed and pertinent positives are indicated in the HPI above.  All other systems are negative.   Physical Exam No direct physical exam was performed (except for noted visual exam findings with Video Visits).     Vital Signs: There were no vitals  taken for this visit.  Imaging: IR EMBO ART  VEN HEMORR LYMPH EXTRAV  INC GUIDE ROADMAPPING Result Date: 10/26/2023 CLINICAL DATA:  Remote history of pancreatitis and chronic splenic vein occlusion. Enlarging gastric varices. Small splenorenal shunt suggested on recent CT. Patient presents for prophylactic BRTO. EXAM: LEFT RENAL VENOGRAM MEDICATIONS: MEDICATIONS No periprocedural antibiotics were indicated. ANESTHESIA/SEDATION: Intravenous Fentanyl  200mcg and Versed  4mg  were administered by RN during a total moderate (conscious) sedation time of 32 minutes; the patient's level of consciousness and physiological / cardiorespiratory status were monitored continuously by radiology RN under my direct supervision. COMPLICATIONS: None immediate. PROCEDURE: Informed written consent was obtained from the patient after a thorough discussion of the procedural risks, benefits and alternatives. All questions were addressed. Maximal Sterile Barrier Technique was utilized including caps, mask, sterile gowns, sterile gloves, sterile drape,  hand hygiene and skin antiseptic. A timeout was performed prior to the initiation of the procedure. Patency of right common femoral vessels confirmed with ultrasound. Overlying skin prepped with chlorhexidine, draped in usual sterile fashion, infiltrated locally with 1% lidocaine . Under real-time ultrasound guidance, micropuncture access obtained to the right common femoral vein, exchanged over Amplatz wire for a long 7 [place] vascular sheath. A coaxial C2 catheter was advanced and used to selectively catheterize the left renal vein. The sheath was advanced into the central aspect of the left renal vein to allow a stable working platform. Left renal venography was performed. The C2 was exchanged for a 5 [place] Kumpe catheter, used to selectively catheterize multiple tributaries to the left renal vein in attempts to catheterize the splenorenal shunt. A final left renal venogram was obtained  through the sheath. Sheath catheter and guidewire were then removed and hemostasis achieved at the site with manual compression. The patient tolerated the procedure well. FINDINGS: Left renal vein is patent. Multiple left retroperitoneal tributaries catheterized. None provided significant opacification of gastric varices. No splenorenal shunt identified. Therefore, BRTO was not performed. IMPRESSION: 1. Normal left renal venogram. No splenorenal shunt identified. BRTO deferred. Electronically Signed   By: JONETTA Faes M.D.   On: 10/26/2023 15:19   IR US  Guide Vasc Access Right Result Date: 10/26/2023 CLINICAL DATA:  Remote history of pancreatitis and chronic splenic vein occlusion. Enlarging gastric varices. Small splenorenal shunt suggested on recent CT. Patient presents for prophylactic BRTO. EXAM: LEFT RENAL VENOGRAM MEDICATIONS: MEDICATIONS No periprocedural antibiotics were indicated. ANESTHESIA/SEDATION: Intravenous Fentanyl  200mcg and Versed  4mg  were administered by RN during a total moderate (conscious) sedation time of 32 minutes; the patient's level of consciousness and physiological / cardiorespiratory status were monitored continuously by radiology RN under my direct supervision. COMPLICATIONS: None immediate. PROCEDURE: Informed written consent was obtained from the patient after a thorough discussion of the procedural risks, benefits and alternatives. All questions were addressed. Maximal Sterile Barrier Technique was utilized including caps, mask, sterile gowns, sterile gloves, sterile drape, hand hygiene and skin antiseptic. A timeout was performed prior to the initiation of the procedure. Patency of right common femoral vessels confirmed with ultrasound. Overlying skin prepped with chlorhexidine, draped in usual sterile fashion, infiltrated locally with 1% lidocaine . Under real-time ultrasound guidance, micropuncture access obtained to the right common femoral vein, exchanged over Amplatz wire for  a long 7 [place] vascular sheath. A coaxial C2 catheter was advanced and used to selectively catheterize the left renal vein. The sheath was advanced into the central aspect of the left renal vein to allow a stable working platform. Left renal venography was performed. The C2 was exchanged for a 5 [place] Kumpe catheter, used to selectively catheterize multiple tributaries to the left renal vein in attempts to catheterize the splenorenal shunt. A final left renal venogram was obtained through the sheath. Sheath catheter and guidewire were then removed and hemostasis achieved at the site with manual compression. The patient tolerated the procedure well. FINDINGS: Left renal vein is patent. Multiple left retroperitoneal tributaries catheterized. None provided significant opacification of gastric varices. No splenorenal shunt identified. Therefore, BRTO was not performed. IMPRESSION: 1. Normal left renal venogram. No splenorenal shunt identified. BRTO deferred. Electronically Signed   By: JONETTA Faes M.D.   On: 10/26/2023 15:19   IR Venogram Renal Uni Left Result Date: 10/26/2023 CLINICAL DATA:  Remote history of pancreatitis and chronic splenic vein occlusion. Enlarging gastric varices. Small splenorenal shunt suggested on recent CT. Patient presents  for prophylactic BRTO. EXAM: LEFT RENAL VENOGRAM MEDICATIONS: MEDICATIONS No periprocedural antibiotics were indicated. ANESTHESIA/SEDATION: Intravenous Fentanyl  200mcg and Versed  4mg  were administered by RN during a total moderate (conscious) sedation time of 32 minutes; the patient's level of consciousness and physiological / cardiorespiratory status were monitored continuously by radiology RN under my direct supervision. COMPLICATIONS: None immediate. PROCEDURE: Informed written consent was obtained from the patient after a thorough discussion of the procedural risks, benefits and alternatives. All questions were addressed. Maximal Sterile Barrier Technique was  utilized including caps, mask, sterile gowns, sterile gloves, sterile drape, hand hygiene and skin antiseptic. A timeout was performed prior to the initiation of the procedure. Patency of right common femoral vessels confirmed with ultrasound. Overlying skin prepped with chlorhexidine, draped in usual sterile fashion, infiltrated locally with 1% lidocaine . Under real-time ultrasound guidance, micropuncture access obtained to the right common femoral vein, exchanged over Amplatz wire for a long 7 [place] vascular sheath. A coaxial C2 catheter was advanced and used to selectively catheterize the left renal vein. The sheath was advanced into the central aspect of the left renal vein to allow a stable working platform. Left renal venography was performed. The C2 was exchanged for a 5 [place] Kumpe catheter, used to selectively catheterize multiple tributaries to the left renal vein in attempts to catheterize the splenorenal shunt. A final left renal venogram was obtained through the sheath. Sheath catheter and guidewire were then removed and hemostasis achieved at the site with manual compression. The patient tolerated the procedure well. FINDINGS: Left renal vein is patent. Multiple left retroperitoneal tributaries catheterized. None provided significant opacification of gastric varices. No splenorenal shunt identified. Therefore, BRTO was not performed. IMPRESSION: 1. Normal left renal venogram. No splenorenal shunt identified. BRTO deferred. Electronically Signed   By: JONETTA Faes M.D.   On: 10/26/2023 15:19    Labs:  CBC: Recent Labs    10/26/23 0656  WBC 4.9  HGB 11.1*  HCT 33.2*  PLT 122*    COAGS: Recent Labs    10/26/23 0656  INR 1.0    BMP: Recent Labs    10/26/23 0656  NA 137  K 4.2  CL 105  CO2 26  GLUCOSE 119*  BUN 10  CALCIUM 8.9  CREATININE 1.11  GFRNONAA >60    LIVER FUNCTION TESTS: No results for input(s): BILITOT, AST, ALT, ALKPHOS, PROT, ALBUMIN in the  last 8760 hours.  TUMOR MARKERS: No results for input(s): AFPTM, CEA, CA199, CHROMGRNA in the last 8760 hours.  Assessment and Plan:  My impression is that he did well post left renal venogram. We spent the bulk of the consultation discussing treatment options for the moderate gastric varices, without bleeding.  --watchful waiting with surveillance endoscopy, potential intervals per Dr. Ciro --partial splenic artery branch embolization to decrease venous outflow and size of varices from the upper pole, with downside of expected post embolization syndrome, otherwise safe and effective --splenectomy, more invasive, major surgery, but definitive --transsplenic venous embolization, isolate gastric outflow from other mesenteric venous channels, risk of capsule hemorrhage, perhaps lower likelihood of durable success.  He seemed to understand and did ask appropriate questions. There is no urgency. He will f/u w/ Dr. Henreitta for his perspective on options and time frame.   Thank you for this interesting consult.  I greatly enjoyed meeting Noah Griffin and look forward to participating in their care.  A copy of this report was sent to the requesting provider on this date.  Electronically Signed: Dayne Keefe Zawistowski  Greysen Devino 11/02/2023, 4:33 PM   I spent a total of    40 Minutes in remote  clinical consultation, greater than 50% of which was counseling/coordinating care for splenic varices.    Visit type: Audio only (telephone). Audio (no video) only due to patient's lack of internet/smartphone capability. Alternative for in-person consultation at [facility], [address] [place], [place], [place].  This format is felt to be most appropriate for this patient at this time.  All issues noted in this document were discussed and addressed.

## 2023-11-02 NOTE — Telephone Encounter (Signed)
 I received a message from Dr. Toribio Faes stating Unable to access gastric varices via BRTO. Options include active surveillance, partial splenic embo, splenectomy, transsplenic venous embo. I favor one of the first 2 options. Pt wants to talk to you about endo surveillance option.  Hi Devere,  Can you please schedule a follow up appointment for this patient in next available with me ?  Thanks,  Toribio Fortune, MD Gastroenterology and Hepatology [facility]

## 2023-11-06 NOTE — Telephone Encounter (Signed)
 Thanks

## 2023-11-13 ENCOUNTER — Other Ambulatory Visit: Payer: Self-pay | Admitting: Interventional Radiology

## 2023-11-13 DIAGNOSIS — I864 Gastric varices: Secondary | ICD-10-CM

## 2023-11-14 ENCOUNTER — Telehealth (INDEPENDENT_AMBULATORY_CARE_PROVIDER_SITE_OTHER): Payer: Self-pay

## 2023-11-14 NOTE — Telephone Encounter (Signed)
 Pt left vm about his recall for his colonoscopy. I returned the patients call and he stated that he would wait until after his appointment with Dr. Eartha on 11/26/2023 to see what the next steps are.

## 2023-11-16 ENCOUNTER — Telehealth (INDEPENDENT_AMBULATORY_CARE_PROVIDER_SITE_OTHER): Payer: Self-pay

## 2023-11-16 NOTE — Telephone Encounter (Signed)
 Who is your primary care physician: Leta WENDI Fear  Reasons for the colonoscopy: screening  Have you had a colonoscopy before?  Yes, 2015  Do you have family history of colon cancer? no  Previous colonoscopy with polyps removed? no  Do you have a history colorectal cancer?   no  Are you diabetic? If yes, Type 1 or Type 2?    no  Do you have a prosthetic or mechanical heart valve? no  Do you have a pacemaker/defibrillator?   no  Have you had endocarditis/atrial fibrillation? no  Have you had joint replacement within the last 12 months?  no  Do you tend to be constipated or have to use laxatives? no  Do you have any history of drugs or alchohol?  Yes, alcohol but sober for 13 years   Do you use supplemental oxygen?  no  Have you had a stroke or heart attack within the last 6 months? no  Do you take weight loss medication?  no  Do you take any blood-thinning medications such as: (aspirin, warfarin, Plavix, Aggrenox)  no  If yes we need the name, milligram, dosage and who is prescribing doctor  Current Outpatient Medications on File Prior to Visit  Medication Sig Dispense Refill   amLODipine (NORVASC) 10 MG tablet Take 10 mg by mouth 2 (two) times daily.     baclofen (LIORESAL) 10 MG tablet Take 10 mg by mouth daily.     clonazePAM (KLONOPIN) 0.5 MG tablet Take 0.5 mg by mouth at bedtime.     escitalopram (LEXAPRO) 20 MG tablet Take 20 mg by mouth daily.     Levothyroxine Sodium 200 MCG/ML SOLN Take 250 mcg by mouth daily before breakfast.     pantoprazole (PROTONIX) 40 MG tablet Take 40 mg by mouth daily.     traZODone (DESYREL) 50 MG tablet Take 100 mg by mouth at bedtime.      valsartan (DIOVAN) 160 MG tablet Take 160 mg by mouth daily.     No current facility-administered medications on file prior to visit.    No Known Allergies   Pharmacy: [facility] in [place]  Primary Insurance Name: Medicare  Best number where you can be reached: [phone]

## 2023-11-16 NOTE — Telephone Encounter (Signed)
 Any room Thanks

## 2023-11-19 NOTE — Telephone Encounter (Signed)
 See prior message he wants to wait until his appt

## 2023-11-23 ENCOUNTER — Ambulatory Visit
Admission: RE | Admit: 2023-11-23 | Discharge: 2023-11-23 | Disposition: A | Discharge status: Home / Self Care | Source: Ambulatory Visit | Attending: Interventional Radiology | Admitting: Interventional Radiology

## 2023-11-23 DIAGNOSIS — I864 Gastric varices: Secondary | ICD-10-CM

## 2023-11-23 HISTORY — PX: IR RADIOLOGIST EVAL & MGMT: IMG5224

## 2023-11-23 NOTE — Progress Notes (Signed)
 Patient ID: Noah Griffin, male   DOB: December 05, 1955, 68 y.o.   MRN: [id number]  Telephone call PT is planning to see Dr. Castaneda Mayorga soon. We reviewed my prior options and recommendations for the gastric varices secondary to chronic splenic vein thrmobosis, answered some specific questions re technique etc. He is motivated to proceed with partial splenic artery embolization to decrease splenic venous flow through the varices to stabilize or shrink varices. We reviewed the technique and technical details of partial splenic arterial coil embolization, anticipated benefits, possible risks and complications. We discussed this could likely perform as outpatient procedures, possibly with overnight observation. We discussed potential postop pain, and pain control options. He seemed to understand and did ask appropriate questions which were answered. We will await input from Dr. Eartha Flavors regarding current gastric variceal size and rupture risk, to inforrm timing of any intervention. He has our clinic number for any other questions or concerns.

## 2023-11-26 ENCOUNTER — Encounter (INDEPENDENT_AMBULATORY_CARE_PROVIDER_SITE_OTHER): Payer: Self-pay | Admitting: Gastroenterology

## 2023-11-26 ENCOUNTER — Ambulatory Visit (INDEPENDENT_AMBULATORY_CARE_PROVIDER_SITE_OTHER): Admitting: Gastroenterology

## 2023-11-26 VITALS — BP 129/70 | HR 65 | Temp 97.5°F | Ht 70.0 in | Wt 155.2 lb

## 2023-11-26 DIAGNOSIS — I82890 Acute embolism and thrombosis of other specified veins: Secondary | ICD-10-CM

## 2023-11-26 DIAGNOSIS — K860 Alcohol-induced chronic pancreatitis: Secondary | ICD-10-CM | POA: Diagnosis not present

## 2023-11-26 DIAGNOSIS — I864 Gastric varices: Secondary | ICD-10-CM | POA: Diagnosis not present

## 2023-11-26 NOTE — Patient Instructions (Addendum)
 Proceed with partial splenic artery embolization with Dr. Johann Alcohol and cigarette avoidance Call back when ready to schedule screening colonoscopy

## 2023-11-26 NOTE — Progress Notes (Unsigned)
 Toribio Fortune, M.D. Gastroenterology & Hepatology [facility] Gastroenterology [address] [place], [place] [address]  Primary Care Physician: Rosamond Leta NOVAK, MD [address] [place] [address]  I will communicate my assessment and recommendations to the referring MD via EMR.  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  The patient was last seen on 08/13/2023. At that time, the patient was scheduled for EGD with findings described below.  He was advised to avoid alcohol and cigarette.  Patient was referred to IR given presence of gastric varices. ***  The patient denies having any nausea, vomiting, fever, chills, hematochezia, melena, hematemesis, abdominal distention, abdominal pain, diarrhea, jaundice, pruritus. Has slowly gained some weight.   Patient does not smoke or drink any alcohol.  Last EGD: 08/24/23 - Normal esophagus. - Z- line regular, 40 cm from the incisors. - Type 1 isolated gastric varices ( IGV1, varices located in the fundus) , without bleeding. - Normal examined duodenum.  Last Colonoscopy:11/20/2013 Prep excellent. Normal mucosa of terminal ileum. Few small diverticula at sigmoid colon. Normal rectal mucosa. Focal thickening to anoderm  Past Medical History: Past Medical History:  Diagnosis Date   Anxiety    Esophageal varices (HCC)    Hypertension    Pancreatitis    Thyroid condition     Past Surgical History: Past Surgical History:  Procedure Laterality Date   COLONOSCOPY N/A 11/20/2013   Procedure: COLONOSCOPY;  Surgeon: Claudis RAYMOND Rivet, MD;  [facility];  Service: Endoscopy;  Laterality: N/A;  730-rescheduled same time Ann notified pt   ESOPHAGOGASTRODUODENOSCOPY N/A 08/24/2023   Procedure: EGD (ESOPHAGOGASTRODUODENOSCOPY);  Surgeon: Fortune Flavors, Toribio, MD;  [facility];  Service: Gastroenterology;  Laterality: N/A;  900AM, ASA 1-2   HERNIA REPAIR     IR EMBO ART  VEN HEMORR LYMPH EXTRAV  INC GUIDE ROADMAPPING  10/26/2023   IR RADIOLOGIST EVAL & MGMT  10/03/2023   IR RADIOLOGIST EVAL & MGMT  11/02/2023   IR US  GUIDE VASC ACCESS RIGHT  10/26/2023   IR VENOGRAM RENAL UNI LEFT  10/26/2023    Family History: Family History  Problem Relation Age of Onset   Parkinsonism Mother    Pancreatic cancer Father    Healthy Son     Social History: Social History   Tobacco Use  Smoking Status Former   Types: Cigars  Smokeless Tobacco Never  Tobacco Comments   patient smokes 1 cigar a day   Social History   Substance and Sexual Activity  Alcohol Use No   Comment: Quit using alcohol 3-4 years ago following acute Pancreatitis Attack   Social History   Substance and Sexual Activity  Drug Use No    Allergies: No Known Allergies  Medications: Current Outpatient Medications  Medication Sig Dispense Refill   amLODipine (NORVASC) 10 MG tablet Take 10 mg by mouth 2 (two) times daily.     baclofen (LIORESAL) 10 MG tablet Take 10 mg by mouth daily.     clonazePAM (KLONOPIN) 0.5 MG tablet Take 0.5 mg by mouth at bedtime.     escitalopram (LEXAPRO) 20 MG tablet Take 20 mg by mouth daily.  Levothyroxine Sodium 200 MCG/ML SOLN Take 250 mcg by mouth daily before breakfast.     pantoprazole (PROTONIX) 40 MG tablet Take 40 mg by mouth daily.     traZODone (DESYREL) 50 MG tablet Take 100 mg by mouth at bedtime.      valsartan (DIOVAN) 160 MG tablet Take 160 mg by mouth daily. (Patient taking differently: Take 320 mg by mouth daily.)     No current facility-administered medications for this  visit.    Review of Systems: GENERAL: negative for malaise, night sweats HEENT: No changes in hearing or vision, no nose bleeds or other nasal problems. NECK: Negative for lumps, goiter, pain and significant neck swelling RESPIRATORY: Negative for cough, wheezing CARDIOVASCULAR: Negative for chest pain, leg swelling, palpitations, orthopnea GI: SEE HPI MUSCULOSKELETAL: Negative for joint pain or swelling, back pain, and muscle pain. SKIN: Negative for lesions, rash PSYCH: Negative for sleep disturbance, mood disorder and recent psychosocial stressors. HEMATOLOGY Negative for prolonged bleeding, bruising easily, and swollen nodes. ENDOCRINE: Negative for cold or heat intolerance, polyuria, polydipsia and goiter. NEURO: negative for tremor, gait imbalance, syncope and seizures. The remainder of the review of systems is noncontributory.   Physical Exam: BP 129/70 (BP Location: Right Arm, Patient Position: Sitting, Cuff Size: Normal)   Pulse 65   Temp (!) 97.5 F (36.4 C) (Temporal)   Ht 5' 10 (1.778 m)   Wt 155 lb 3.2 oz (70.4 kg)   BMI 22.27 kg/m  GENERAL: The patient is AO x3, in no acute distress. HEENT: Head is normocephalic and atraumatic. EOMI are intact. Mouth is well hydrated and without lesions. NECK: Supple. No masses LUNGS: Clear to auscultation. No presence of rhonchi/wheezing/rales. Adequate chest expansion HEART: RRR, normal s1 and s2. ABDOMEN: Soft, nontender, no guarding, no peritoneal signs, and nondistended. BS +. No masses. RECTAL EXAM: no external lesions, normal tone, no masses, brown stool without blood.*** Chaperone: EXTREMITIES: Without any cyanosis, clubbing, rash, lesions or edema. NEUROLOGIC: AOx3, no focal motor deficit. SKIN: no jaundice, no rashes  Imaging/Labs: as above  I personally reviewed and interpreted the available labs, imaging and endoscopic files.  Impression and Plan: Noah Griffin is a 68 y.o. male coming for follow up of  ***   All questions were answered.      Toribio Fortune, MD Gastroenterology and Hepatology [facility] Gastroenterology

## 2023-12-04 ENCOUNTER — Other Ambulatory Visit (HOSPITAL_COMMUNITY): Payer: Self-pay | Admitting: Interventional Radiology

## 2023-12-04 DIAGNOSIS — I864 Gastric varices: Secondary | ICD-10-CM

## 2023-12-11 ENCOUNTER — Other Ambulatory Visit: Payer: Self-pay | Admitting: Diagnostic Radiology

## 2023-12-11 DIAGNOSIS — Z01818 Encounter for other preprocedural examination: Secondary | ICD-10-CM

## 2023-12-12 ENCOUNTER — Observation Stay (HOSPITAL_COMMUNITY)
Admission: EM | Admit: 2023-12-12 | Discharge: 2023-12-13 | Disposition: A | Discharge status: Home / Self Care | Attending: Internal Medicine | Admitting: Internal Medicine

## 2023-12-12 ENCOUNTER — Encounter (HOSPITAL_COMMUNITY): Payer: Self-pay | Admitting: Internal Medicine

## 2023-12-12 ENCOUNTER — Other Ambulatory Visit (HOSPITAL_COMMUNITY): Payer: Self-pay | Admitting: Interventional Radiology

## 2023-12-12 ENCOUNTER — Other Ambulatory Visit: Payer: Self-pay

## 2023-12-12 ENCOUNTER — Ambulatory Visit (HOSPITAL_COMMUNITY)
Admission: RE | Admit: 2023-12-12 | Discharge: 2023-12-12 | Disposition: A | Discharge status: Home / Self Care | Source: Ambulatory Visit | Attending: Internal Medicine | Admitting: Internal Medicine

## 2023-12-12 DIAGNOSIS — I10 Essential (primary) hypertension: Secondary | ICD-10-CM | POA: Insufficient documentation

## 2023-12-12 DIAGNOSIS — I864 Gastric varices: Secondary | ICD-10-CM | POA: Insufficient documentation

## 2023-12-12 DIAGNOSIS — K769 Liver disease, unspecified: Secondary | ICD-10-CM | POA: Insufficient documentation

## 2023-12-12 DIAGNOSIS — F1010 Alcohol abuse, uncomplicated: Secondary | ICD-10-CM | POA: Insufficient documentation

## 2023-12-12 DIAGNOSIS — K861 Other chronic pancreatitis: Principal | ICD-10-CM | POA: Insufficient documentation

## 2023-12-12 DIAGNOSIS — Z8719 Personal history of other diseases of the digestive system: Secondary | ICD-10-CM | POA: Insufficient documentation

## 2023-12-12 DIAGNOSIS — R001 Bradycardia, unspecified: Secondary | ICD-10-CM | POA: Diagnosis present

## 2023-12-12 DIAGNOSIS — Z79899 Other long term (current) drug therapy: Secondary | ICD-10-CM | POA: Diagnosis not present

## 2023-12-12 DIAGNOSIS — D696 Thrombocytopenia, unspecified: Secondary | ICD-10-CM | POA: Insufficient documentation

## 2023-12-12 DIAGNOSIS — R109 Unspecified abdominal pain: Secondary | ICD-10-CM | POA: Diagnosis present

## 2023-12-12 DIAGNOSIS — D649 Anemia, unspecified: Secondary | ICD-10-CM | POA: Insufficient documentation

## 2023-12-12 DIAGNOSIS — I8500 Esophageal varices without bleeding: Secondary | ICD-10-CM | POA: Diagnosis not present

## 2023-12-12 DIAGNOSIS — I82890 Acute embolism and thrombosis of other specified veins: Principal | ICD-10-CM

## 2023-12-12 DIAGNOSIS — R1012 Left upper quadrant pain: Secondary | ICD-10-CM

## 2023-12-12 DIAGNOSIS — K860 Alcohol-induced chronic pancreatitis: Secondary | ICD-10-CM | POA: Diagnosis not present

## 2023-12-12 DIAGNOSIS — F1011 Alcohol abuse, in remission: Secondary | ICD-10-CM | POA: Diagnosis present

## 2023-12-12 DIAGNOSIS — Z87891 Personal history of nicotine dependence: Secondary | ICD-10-CM | POA: Insufficient documentation

## 2023-12-12 DIAGNOSIS — Z01818 Encounter for other preprocedural examination: Secondary | ICD-10-CM

## 2023-12-12 HISTORY — PX: IR US GUIDE VASC ACCESS RIGHT: IMG2390

## 2023-12-12 HISTORY — PX: IR ANGIOGRAM VISCERAL SELECTIVE: IMG657

## 2023-12-12 HISTORY — PX: IR EMBO ARTERIAL NOT HEMORR HEMANG INC GUIDE ROADMAPPING: IMG5448

## 2023-12-12 LAB — BASIC METABOLIC PANEL WITH GFR
Anion gap: 8 (ref 5–15)
BUN: 11 mg/dL (ref 8–23)
CO2: 25 mmol/L (ref 22–32)
Calcium: 8.6 mg/dL — ABNORMAL LOW (ref 8.9–10.3)
Chloride: 104 mmol/L (ref 98–111)
Creatinine, Ser: 1.16 mg/dL (ref 0.61–1.24)
GFR, Estimated: >60 mL/min (ref >60)
Glucose, Bld: 108 mg/dL — ABNORMAL HIGH (ref 70–99)
Potassium: 4.6 mmol/L (ref 3.5–5.1)
Sodium: 137 mmol/L (ref 135–145)

## 2023-12-12 LAB — CBC WITH DIFFERENTIAL/PLATELET
Abs Immature Granulocytes: 0.04 K/uL (ref 0.00–0.07)
Basophils Absolute: 0.1 K/uL (ref 0.0–0.1)
Basophils Relative: 1 %
Eosinophils Absolute: 0.4 K/uL (ref 0.0–0.5)
Eosinophils Relative: 4 %
HCT: 34.7 % — ABNORMAL LOW (ref 39.0–52.0)
Hemoglobin: 11.6 g/dL — ABNORMAL LOW (ref 13.0–17.0)
Immature Granulocytes: 0 %
Lymphocytes Relative: 27 %
Lymphs Abs: 2.5 K/uL (ref 0.7–4.0)
MCH: 32.2 pg (ref 26.0–34.0)
MCHC: 33.4 g/dL (ref 30.0–36.0)
MCV: 96.4 fL (ref 80.0–100.0)
Monocytes Absolute: 0.5 K/uL (ref 0.1–1.0)
Monocytes Relative: 6 %
Neutro Abs: 5.7 K/uL (ref 1.7–7.7)
Neutrophils Relative %: 62 %
Platelets: 139 K/uL — ABNORMAL LOW (ref 150–400)
RBC: 3.6 MIL/uL — ABNORMAL LOW (ref 4.22–5.81)
RDW: 12.2 % (ref 11.5–15.5)
WBC: 9.2 K/uL (ref 4.0–10.5)
nRBC: 0 % (ref 0.0–0.2)

## 2023-12-12 LAB — PROTIME-INR
INR: 1 (ref 0.8–1.2)
Prothrombin Time: 14.3 s (ref 11.4–15.2)

## 2023-12-12 LAB — APTT: aPTT: 30 s (ref 24–36)

## 2023-12-12 MED ORDER — HYDROMORPHONE HCL 1 MG/ML IJ SOLN
INTRAMUSCULAR | Status: AC
  Filled 2023-12-12: qty 1

## 2023-12-12 MED ORDER — LIDOCAINE HCL 1 % IJ SOLN
INTRAMUSCULAR | Status: AC
  Filled 2023-12-12: qty 20

## 2023-12-12 MED ORDER — FENTANYL CITRATE PF 50 MCG/ML IJ SOSY
12.5000 ug | PREFILLED_SYRINGE | INTRAMUSCULAR | Status: DC | PRN
  Administered 2023-12-12: 12.5 ug via INTRAVENOUS
  Administered 2023-12-13 (×3): 50 ug via INTRAVENOUS
  Filled 2023-12-12 (×4): qty 1

## 2023-12-12 MED ORDER — LEVOTHYROXINE SODIUM 200 MCG/ML PO SOLN
250.0000 ug | Freq: Every day | ORAL | Status: DC
Start: 2023-12-13 — End: 2023-12-12

## 2023-12-12 MED ORDER — CEFAZOLIN SODIUM-DEXTROSE 2-4 GM/100ML-% IV SOLN: 2.0000 g | INTRAVENOUS | Status: DC

## 2023-12-12 MED ORDER — PANTOPRAZOLE SODIUM 40 MG PO TBEC
40.0000 mg | DELAYED_RELEASE_TABLET | Freq: Every day | ORAL | Status: DC
  Administered 2023-12-12 – 2023-12-13 (×2): 40 mg via ORAL
  Filled 2023-12-12 (×2): qty 1

## 2023-12-12 MED ORDER — ONDANSETRON HCL 4 MG/2ML IJ SOLN: 4.0000 mg | Freq: Four times a day (QID) | INTRAMUSCULAR | Status: DC | PRN

## 2023-12-12 MED ORDER — ACETAMINOPHEN 325 MG PO TABS
650.0000 mg | ORAL_TABLET | Freq: Four times a day (QID) | ORAL | Status: DC | PRN
  Administered 2023-12-12: 650 mg via ORAL
  Filled 2023-12-12: qty 2

## 2023-12-12 MED ORDER — MIDAZOLAM HCL 2 MG/2ML IJ SOLN
INTRAMUSCULAR | Status: AC
  Filled 2023-12-12: qty 2

## 2023-12-12 MED ORDER — IOHEXOL 300 MG/ML  SOLN
100.0000 mL | Freq: Once | INTRAMUSCULAR | Status: AC | PRN
  Administered 2023-12-12: 25 mL via INTRA_ARTERIAL

## 2023-12-12 MED ORDER — MIDAZOLAM HCL 2 MG/2ML IJ SOLN
INTRAMUSCULAR | Status: AC | PRN
  Administered 2023-12-12 (×10): .5 mg via INTRAVENOUS
  Administered 2023-12-12: 1 mg via INTRAVENOUS
  Administered 2023-12-12 (×4): .5 mg via INTRAVENOUS

## 2023-12-12 MED ORDER — HYDROMORPHONE HCL 1 MG/ML IJ SOLN
INTRAMUSCULAR | Status: AC | PRN
  Administered 2023-12-12 (×2): .5 mg via INTRAVENOUS

## 2023-12-12 MED ORDER — CEFAZOLIN SODIUM-DEXTROSE 2-4 GM/100ML-% IV SOLN
INTRAVENOUS | Status: AC | PRN
  Administered 2023-12-12: 2 g via INTRAVENOUS

## 2023-12-12 MED ORDER — AMLODIPINE BESYLATE 5 MG PO TABS
5.0000 mg | ORAL_TABLET | Freq: Two times a day (BID) | ORAL | Status: DC
  Administered 2023-12-13: 5 mg via ORAL
  Filled 2023-12-12: qty 1

## 2023-12-12 MED ORDER — TRAZODONE HCL 50 MG PO TABS
100.0000 mg | ORAL_TABLET | Freq: Every day | ORAL | Status: DC
  Administered 2023-12-12: 100 mg via ORAL
  Filled 2023-12-12: qty 2

## 2023-12-12 MED ORDER — LIDOCAINE HCL 1 % IJ SOLN
20.0000 mL | Freq: Once | INTRAMUSCULAR | Status: AC
  Administered 2023-12-12: 10 mL via INTRADERMAL

## 2023-12-12 MED ORDER — ACETAMINOPHEN 650 MG RE SUPP: 650.0000 mg | Freq: Four times a day (QID) | RECTAL | Status: DC | PRN

## 2023-12-12 MED ORDER — CEFAZOLIN SODIUM-DEXTROSE 2-4 GM/100ML-% IV SOLN
INTRAVENOUS | Status: AC
  Filled 2023-12-12: qty 100

## 2023-12-12 MED ORDER — HYDROCODONE-ACETAMINOPHEN 5-325 MG PO TABS
ORAL_TABLET | ORAL | Status: AC
  Filled 2023-12-12: qty 2

## 2023-12-12 MED ORDER — IOHEXOL 300 MG/ML  SOLN
100.0000 mL | Freq: Once | INTRAMUSCULAR | Status: AC | PRN
  Administered 2023-12-12: 40 mL via INTRA_ARTERIAL

## 2023-12-12 MED ORDER — ESCITALOPRAM OXALATE 10 MG PO TABS
20.0000 mg | ORAL_TABLET | Freq: Every day | ORAL | Status: DC
  Administered 2023-12-13: 20 mg via ORAL
  Filled 2023-12-12: qty 2

## 2023-12-12 MED ORDER — HYDROMORPHONE HCL 1 MG/ML IJ SOLN
INTRAMUSCULAR | Status: AC | PRN
  Administered 2023-12-12 (×10): .5 mg via INTRAVENOUS

## 2023-12-12 MED ORDER — ONDANSETRON HCL 4 MG PO TABS: 4.0000 mg | ORAL_TABLET | Freq: Four times a day (QID) | ORAL | Status: DC | PRN

## 2023-12-12 MED ORDER — LEVOTHYROXINE SODIUM 50 MCG PO TABS
250.0000 ug | ORAL_TABLET | Freq: Every day | ORAL | Status: DC
  Administered 2023-12-13: 250 ug via ORAL
  Filled 2023-12-12: qty 1

## 2023-12-12 MED ORDER — HYDROCODONE-ACETAMINOPHEN 5-325 MG PO TABS
1.0000 | ORAL_TABLET | ORAL | Status: DC | PRN
  Administered 2023-12-12: 2 via ORAL

## 2023-12-12 MED ORDER — SODIUM CHLORIDE 0.9 % IV SOLN: INTRAVENOUS | Status: DC

## 2023-12-12 MED ORDER — IOHEXOL 300 MG/ML  SOLN
150.0000 mL | Freq: Once | INTRAMUSCULAR | Status: AC | PRN
  Administered 2023-12-12: 110 mL via INTRA_ARTERIAL

## 2023-12-12 MED ORDER — CLONAZEPAM 0.5 MG PO TABS
0.5000 mg | ORAL_TABLET | Freq: Every day | ORAL | Status: DC
  Administered 2023-12-12: 0.5 mg via ORAL
  Filled 2023-12-12: qty 1

## 2023-12-12 NOTE — Assessment & Plan Note (Signed)
 Chronic stable

## 2023-12-12 NOTE — H&P (Signed)
 Chief Complaint: Gastrosplenic varices - IR consulted for partial splenic artery embolization  Referring Provider(s): Eartha Angelia Sieving   Supervising Physician: Johann Sieving  Patient Status: [facility] - Out-pt  History of Present Illness: Noah Griffin is a 68 y.o. 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 Patient was referred to IR after and underwent left renal venogram with BRTO on 10/26/23, which showed no communication to gastric varices to allow BRTO. Pt then met with Dr. Johann in clinic on 11/02/23 where they discussed alternative options, including partial splenic artery emobolization vs watchful waiting vs splenectomy vs trans splenic venous embolization. These were then discussed again with GI physician Dr. Eartha Angelia. Decision after these consultations was to proceed with partial splenic artery embolization with IR, for which patient presents today.  Pt without complaint today. Has been NPO. Denies blood thinners. All questions answered.   Patient is Full Code  Past Medical History:  Diagnosis Date   Anxiety    Esophageal varices (HCC)    Hypertension    Pancreatitis    Thyroid condition     Past Surgical History:  Procedure Laterality Date   COLONOSCOPY N/A 11/20/2013   Procedure: COLONOSCOPY;  Surgeon: Claudis RAYMOND Rivet, MD;  [facility];  Service: Endoscopy;  Laterality: N/A;  730-rescheduled same time Ann notified pt   ESOPHAGOGASTRODUODENOSCOPY N/A 08/24/2023   Procedure: EGD (ESOPHAGOGASTRODUODENOSCOPY);  Surgeon: Eartha Angelia, Sieving, MD;  [facility];  Service: Gastroenterology;  Laterality: N/A;  900AM, ASA 1-2   HERNIA REPAIR     IR EMBO ART  VEN HEMORR LYMPH EXTRAV  INC GUIDE ROADMAPPING  10/26/2023   IR RADIOLOGIST EVAL & MGMT  10/03/2023   IR RADIOLOGIST EVAL & MGMT  11/02/2023   IR RADIOLOGIST EVAL & MGMT  11/23/2023   IR US  GUIDE VASC ACCESS RIGHT   10/26/2023   IR VENOGRAM RENAL UNI LEFT  10/26/2023    Allergies: Patient has no known allergies.  Medications: Prior to Admission medications   Medication Sig Start Date End Date Taking? Authorizing Provider  amLODipine (NORVASC) 10 MG tablet Take 10 mg by mouth 2 (two) times daily.   Yes [provider]  escitalopram (LEXAPRO) 20 MG tablet Take 20 mg by mouth daily.   Yes [provider]  Levothyroxine Sodium 200 MCG/ML SOLN Take 250 mcg by mouth daily before breakfast.   Yes [provider]  pantoprazole (PROTONIX) 40 MG tablet Take 40 mg by mouth daily.   Yes [provider]  valsartan (DIOVAN) 160 MG tablet Take 160 mg by mouth daily. Patient taking differently: Take 320 mg by mouth daily.   Yes [provider]  baclofen (LIORESAL) 10 MG tablet Take 10 mg by mouth daily.    [provider]  clonazePAM (KLONOPIN) 0.5 MG tablet Take 0.5 mg by mouth at bedtime.    [provider]  traZODone (DESYREL) 50 MG tablet Take 100 mg by mouth at bedtime.     [provider]     Family History  Problem Relation Age of Onset   Parkinsonism Mother    Pancreatic cancer Father    Healthy Son     Social History   Socioeconomic History   Marital status: Married    Spouse name: Not on file   Number of children: Not on file   Years of education: Not on file   Highest education level: Not on file  Occupational History  Not on file  Tobacco Use   Smoking status: Former    Types: Cigars   Smokeless tobacco: Never   Tobacco comments:    patient smokes 1 cigar a day  Vaping Use   Vaping status: Never Used  Substance and Sexual Activity   Alcohol use: No    Comment: Quit using alcohol 3-4 years ago following acute Pancreatitis Attack   Drug use: No   Sexual activity: Not on file  Other Topics Concern   Not on file  Social History Narrative   Not on file   Social Drivers of Health   Financial Resource Strain:  Low Risk  (06/06/2023)   Received from [facility]   Overall Financial Resource Strain (CARDIA)    Difficulty of Paying Living Expenses: Not very hard  Food Insecurity: No Food Insecurity (06/06/2023)   Received from [facility]   Hunger Vital Sign    Within the past 12 months, you worried that your food would run out before you got the money to buy more.: Never true    Within the past 12 months, the food you bought just didn't last and you didn't have money to get more.: Never true  Transportation Needs: No Transportation Needs (06/06/2023)   [facility] from [facility]   PRAPARE - Transportation    Lack of Transportation (Medical): No    Lack of Transportation (Non-Medical): No  Physical Activity: Not on file  Stress: Not on file  Social Connections: Not on file     Review of Systems: A 12 point ROS discussed and pertinent positives are indicated in the HPI above.  All other systems are negative.   Vital Signs: BP (!) 148/79   Pulse (!) 45   Temp 98 F (36.7 C) (Oral)   Resp 17   Ht 5' 10 (1.778 m)   Wt 153 lb (69.4 kg)   SpO2 97%   BMI 21.95 kg/m   Advance Care Plan: No documents on file  Physical Exam Vitals and nursing note reviewed.  Constitutional:      General: He is not in acute distress. HENT:     Mouth/Throat:     Mouth: Mucous membranes are moist.     Pharynx: Oropharynx is clear.  Cardiovascular:     Rate and Rhythm: Normal rate and regular rhythm.  Pulmonary:     Effort: Pulmonary effort is normal.     Breath sounds: Normal breath sounds.  Abdominal:     Palpations: Abdomen is soft.     Tenderness: There is no abdominal tenderness.  Musculoskeletal:     Right lower leg: No edema.     Left lower leg: No edema.  Skin:    General: Skin is warm and dry.  Neurological:     Mental Status: He is alert and oriented to person, place, and time. Mental status is at baseline.     Imaging: IR Radiologist Eval & Mgmt Result Date: 12/03/2023 EXAM:  IR General TECHNIQUE: Telephone discussion. FINDINGS: A telephone discussion was held with the patient. The patient is planning to see Dr. Castaneda Mayorga soon. 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 The patient is motivated to proceed with partial splenic artery embolization to decrease splenic venous flow through the varices to stabilize or shrink varices. The technique and technical details of partial splenic arterial coil embolization, anticipated benefits, possible risks, and complications were reviewed.  It was discussed that this could likely be performed as outpatient procedures, possibly with overnight observation. Potential postop pain and pain control options were discussed. The patient seemed to understand and asked appropriate questions which were answered. Input from Dr. Eartha Flavors regarding current gastric variceal size and rupture risk will be awaited to inform the timing of any intervention. The patient has our clinic number for any other questions or concerns. IMPRESSION: 1. 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 2. Plan: Await input from Dr. Eartha Flavors regarding current gastric variceal size and rupture risk to inform timing of any intervention. Electronically signed by: Katheleen Faes MD 12/03/2023 01:22 PM EDT RP Workstation: [id number]    Labs:  CBC: Recent Labs    10/26/23 0656  WBC 4.9  HGB 11.1*  HCT 33.2*  PLT 122*    COAGS: Recent Labs    10/26/23 0656  INR 1.0    BMP: Recent Labs    10/26/23 0656  NA 137  K 4.2  CL 105  CO2 26  GLUCOSE 119*  BUN 10  CALCIUM 8.9  CREATININE 1.11  GFRNONAA >60    LIVER FUNCTION  TESTS: No results for input(s): BILITOT, AST, ALT, ALKPHOS, PROT, ALBUMIN in the last 8760 hours.  TUMOR MARKERS: No results for input(s): AFPTM, CEA, CA199, CHROMGRNA in the last 8760 hours.  Assessment and Plan:  Noah Griffin is a 68 y.o. 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 Patient was referred to IR after and underwent left renal venogram with BRTO on 10/26/23, which showed no communication to gastric varices to allow BRTO. Pt then met with Dr. Faes in clinic on 11/02/23 where they discussed alternative options, including partial splenic artery emobolization vs watchful waiting vs splenectomy vs trans splenic venous embolization. These were then discussed again with GI physician Dr. Eartha Flavors. Decision after these consultations was to proceed with partial splenic artery embolization with IR, for which patient presents today.  The Risks and benefits of partial splenic artery embolization were discussed with the patient including, but not limited to bleeding, infection, vascular injury, post operative pain, or contrast induced renal failure.  This procedure involves the use of X-rays and because of the nature of the planned procedure, it is possible that we will have prolonged use of X-ray fluoroscopy.  Potential radiation risks to you include (but are not limited to) the following: - A slightly elevated risk for cancer several years later in life. This risk is typically less than 0.5% percent. This risk is low in comparison to the normal incidence of human cancer, which is 33% for women and 50% for men according to the American Cancer Society. - Radiation induced injury can include skin redness, resembling a rash, tissue breakdown / ulcers and hair loss (which can be temporary or permanent).   The likelihood of either of these occurring depends on the difficulty of the procedure and whether you  are sensitive to radiation due to previous procedures, disease, or genetic conditions.   IF your procedure requires a prolonged use of radiation, you will be notified and given written instructions for further action.  It is your responsibility to monitor the irradiated area for the 2 weeks following the procedure and to notify your physician if you are concerned that you have suffered a radiation induced injury.  All of the patient's questions were answered, patient is agreeable to proceed. Consent signed and in chart.   Thank you for allowing our service to participate in Noah Griffin 's care.  Electronically Signed: Kimble VEAR Clas, PA-C   12/12/2023, 12:42 PM      I spent a total of  30 Minutes   in face to face in clinical consultation, greater than 50% of which was counseling/coordinating care for partial splenic artery embolization for gastrosplenic varices

## 2023-12-12 NOTE — Assessment & Plan Note (Signed)
 Status post embolization by IR

## 2023-12-12 NOTE — Assessment & Plan Note (Signed)
 Postprocedure postembolization given history of gastric varices.  Will observe overnight and monitor for pain nausea and vomiting treat as needed

## 2023-12-12 NOTE — Assessment & Plan Note (Signed)
 Chronic stable continue home medications ?

## 2023-12-12 NOTE — Assessment & Plan Note (Signed)
-   Supportive management   - Follow up respiratory cx 2/16

## 2023-12-12 NOTE — Assessment & Plan Note (Signed)
 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

## 2023-12-12 NOTE — Procedures (Signed)
  Procedure:  Fluoro and US  guided partial subselective splenic arterial embolization approx 50% parenchyma Preprocedure diagnosis: Diagnoses of Gastric varices, Hypertension, unspecified type, and Preop examination were pertinent to this visit. Postprocedure diagnosis: same EBL:    minimal Complications:   none immediate  See full dictation in [employer].  CHARM Toribio Faes MD Main # [phone] Pager  [phone] Mobile [phone]

## 2023-12-12 NOTE — Subjective & Objective (Signed)
 Patient with history of alcoholic pancreatitis with gastrosplenic varices today he has undergone partial splenic artery embolization procedure was successful but was somewhat prolonged complicated by abdominal pain postprocedure interventional radiology requested hospitalist to observe patient overnight to help with pain management nausea and vomiting and symptom control

## 2023-12-12 NOTE — Plan of Care (Signed)

## 2023-12-12 NOTE — H&P (Signed)
 Noah Griffin FMW:[id number] DOB: [birth year] DOA: 12/12/2023     PCP: Rosamond Leta NOVAK, MD    Patient coming from:    home Lives With family     Chief Complaint: Abdominal pain   HPI: Noah Griffin is a 68 y.o. male with medical history significant of chronic pancreatitis history of gastric varices, hypertension alcohol abuse in remission    Presented with status postprocedure Patient with history of alcoholic pancreatitis with gastrosplenic varices today he has undergone partial splenic artery embolization procedure was successful but was somewhat prolonged complicated by abdominal pain postprocedure interventional radiology requested hospitalist to observe patient overnight to help with pain management nausea and vomiting and symptom control     Denies significant ETOH intake   Does not smoke      Regarding pertinent Chronic problems:       HTN on Norvasc    Hypothyroidism:  on synthroid     Liver disease  Chronic anemia - baseline hg Hemoglobin & Hematocrit  Recent Labs    10/26/23 0656  HGB 11.1*   Iron/TIBC/Ferritin/ %Sat    Component Value Date/Time   IRON 83 05/30/2011 0000   TIBC 330 05/30/2011 0000   FERRITIN 338 (H) 05/30/2011 0000   IRONPCTSAT 25 05/30/2011 0000    ED Triage Vitals  Encounter Vitals Group     BP 12/12/23 2024 139/71     Girls Systolic BP Percentile --      Girls Diastolic BP Percentile --      Boys Systolic BP Percentile --      Boys Diastolic BP Percentile --      Pulse Rate 12/12/23 2024 (!) 44     Resp 12/12/23 2024 17     Temp 12/12/23 2024 98.4 F (36.9 C)     Temp src --      SpO2 12/12/23 2024 99 %     Weight --      Height --      Head Circumference --      Peak Flow --      Pain Score 12/12/23 2026 0     Pain Loc --      Pain Education --      Exclude from Growth Chart --   UFJK(75)@         The recent clinical data is shown below. Vitals:   12/12/23 2024  BP: 139/71  Pulse: (!) 44  Resp: 17  Temp: 98.4  F (36.9 C)  SpO2: 99%       __________________________________________________________ Recent Labs  Lab 12/12/23 1223  NA 137  K 4.6  CO2 25  GLUCOSE 108*  BUN 11  CREATININE 1.16  CALCIUM 8.6*       _______________________________________________ Hospitalist was called for admission for abd pain     OTHER Significant initial  Findings:  labs showing:     _______________________________________________________________________________________________________ Latest  Blood pressure 139/71, pulse (!) 44, temperature 98.4 F (36.9 C), resp. rate 17, SpO2 99%.   Vitals  labs and radiology finding personally reviewed  Review of Systems:    Pertinent positives include:  abd pain  Constitutional:  No weight loss, night sweats, Fevers, chills, fatigue, weight loss  HEENT:  No headaches, Difficulty swallowing,Tooth/dental problems,Sore throat,  No sneezing, itching, ear ache, nasal congestion, post nasal drip,  Cardio-vascular:  No chest pain, Orthopnea, PND, anasarca, dizziness, palpitations.no Bilateral lower extremity swelling  GI:  No heartburn, indigestion  nausea, vomiting, diarrhea, change in bowel habits, loss  of appetite, melena, blood in stool, hematemesis Resp:  no shortness of breath at rest. No dyspnea on exertion, No excess mucus, no productive cough, No non-productive cough, No coughing up of blood.No change in color of mucus.No wheezing. Skin:  no rash or lesions. No jaundice GU:  no dysuria, change in color of urine, no urgency or frequency. No straining to urinate.  No flank pain.  Musculoskeletal:  No joint pain or no joint swelling. No decreased range of motion. No back pain.  Psych:  No change in mood or affect. No depression or anxiety. No memory loss.  Neuro: no localizing neurological complaints, no tingling, no weakness, no double vision, no gait abnormality, no slurred speech, no confusion  All systems reviewed and apart from HOPI all are  negative _______________________________________________________________________________________________ Past Medical History:   Past Medical History:  Diagnosis Date   Anxiety    Esophageal varices (HCC)    Hypertension    Pancreatitis    Thyroid condition      Past Surgical History:  Procedure Laterality Date   COLONOSCOPY N/A 11/20/2013   Procedure: COLONOSCOPY;  Surgeon: Claudis RAYMOND Rivet, MD;  Location: [facility];  Service: Endoscopy;  Laterality: N/A;  730-rescheduled same time Ann notified pt   ESOPHAGOGASTRODUODENOSCOPY N/A 08/24/2023   Procedure: EGD (ESOPHAGOGASTRODUODENOSCOPY);  Surgeon: Eartha Flavors, Toribio, MD;  Location: [facility];  Service: Gastroenterology;  Laterality: N/A;  900AM, ASA 1-2   HERNIA REPAIR     IR EMBO ART  VEN HEMORR LYMPH EXTRAV  INC GUIDE ROADMAPPING  10/26/2023   IR RADIOLOGIST EVAL & MGMT  10/03/2023   IR RADIOLOGIST EVAL & MGMT  11/02/2023   IR RADIOLOGIST EVAL & MGMT  11/23/2023   IR US  GUIDE VASC ACCESS RIGHT  10/26/2023   IR VENOGRAM RENAL UNI LEFT  10/26/2023    Social History:  Ambulatory   independently     reports that he has quit smoking. His smoking use included cigars. He has never used smokeless tobacco. He reports that he does not drink alcohol and does not use drugs.    Family History:   Family History  Problem Relation Age of Onset   Parkinsonism Mother    Pancreatic cancer Father    Healthy Son    ______________________________________________________________________________________________ Allergies: No Known Allergies   Prior to Admission medications   Medication Sig Start Date End Date Taking? Authorizing Provider  amLODipine (NORVASC) 10 MG tablet Take 10 mg by mouth 2 (two) times daily.    [provider]  baclofen (LIORESAL) 10 MG tablet Take 10 mg by mouth daily.    [provider]  clonazePAM (KLONOPIN) 0.5 MG tablet Take 0.5 mg by mouth at bedtime.    [provider]   escitalopram (LEXAPRO) 20 MG tablet Take 20 mg by mouth daily.    [provider]  Levothyroxine Sodium 200 MCG/ML SOLN Take 250 mcg by mouth daily before breakfast.    [provider]  pantoprazole (PROTONIX) 40 MG tablet Take 40 mg by mouth daily.    [provider]  traZODone (DESYREL) 50 MG tablet Take 100 mg by mouth at bedtime.     [provider]  valsartan (DIOVAN) 160 MG tablet Take 160 mg by mouth daily. Patient taking differently: Take 320 mg by mouth daily.    [provider]    ___________________________________________________________________________________________________ Physical Exam:    12/12/2023    8:24 PM 12/12/2023    7:30 PM 12/12/2023    7:15  PM  Vitals with BMI  Systolic 139 156 836  Diastolic 71 91 83  Pulse 44       1. General:  in No  Acute distress   well   -appearing 2. Psychological: Alert and   Oriented 3. Head/ENT:   Dry Mucous Membranes                          Head Non traumatic, neck supple                        Poor Dentition 4. SKIN:  decreased Skin turgor,  Skin clean Dry and intact no rash    5. Heart: slow Regular rate and rhythm no  Murmur, no Rub or gallop 6. Lungs:  , no wheezes or crackles   7. Abdomen: Soft,  non-tender, Non distended bowel sounds present 8. Lower extremities: no clubbing, cyanosis, no  edema 9. Neurologically Grossly intact, moving all 4 extremities equally   10. MSK: Normal range of motion    Chart has been reviewed  ______________________________________________________________________________________________  Assessment/Plan 68 y.o. male with medical history significant of chronic pancreatitis history of gastric varices, hypertension alcohol abuse in remission   Admitted for  abd pain    Present on Admission:  Abdominal pain  Chronic pancreatitis (HCC)  Esophageal and gastric varices (HCC)  History of alcohol abuse  Hypertension  Sinus bradycardia      Abdominal pain Postprocedure postembolization given history of gastric varices.  Will observe overnight and monitor for pain nausea and vomiting treat as needed  Chronic pancreatitis (HCC) Supportive management  Esophageal and gastric varices (HCC) Status post embolization by IR  History of alcohol abuse Currently in remission the last alcoholic drink was 14 years ago  Hypertension Chronic stable continue home medications  Sinus bradycardia Chronic stable   Other plan as per orders.  DVT prophylaxis:  SCD    Code Status:    Code Status: Prior FULL CODE  as per patient   I had personally discussed CODE STATUS with patient    ACP   none    Family Communication:   Family not at  Bedside    Diet heart healthy   Disposition Plan:     To home once workup is complete and patient is stable   Following barriers for discharge:                                                          Pain controlled with PO medications                                                      Consults called:  IR is aware     Obs     Level of care     medical floor         Noah Griffin 12/12/2023, 9:00 PM    [facility]     after 2 AM please page floor coverage   If 7AM-7PM, please contact the day team taking care of the patient using [email or web address]

## 2023-12-13 ENCOUNTER — Other Ambulatory Visit (HOSPITAL_COMMUNITY): Payer: Self-pay

## 2023-12-13 DIAGNOSIS — K861 Other chronic pancreatitis: Secondary | ICD-10-CM | POA: Diagnosis not present

## 2023-12-13 DIAGNOSIS — R1012 Left upper quadrant pain: Secondary | ICD-10-CM | POA: Diagnosis not present

## 2023-12-13 LAB — CBC
HCT: 27.9 % — ABNORMAL LOW (ref 39.0–52.0)
Hemoglobin: 9.7 g/dL — ABNORMAL LOW (ref 13.0–17.0)
MCH: 32.7 pg (ref 26.0–34.0)
MCHC: 34.8 g/dL (ref 30.0–36.0)
MCV: 93.9 fL (ref 80.0–100.0)
Platelets: 96 K/uL — ABNORMAL LOW (ref 150–400)
RBC: 2.97 MIL/uL — ABNORMAL LOW (ref 4.22–5.81)
RDW: 12.2 % (ref 11.5–15.5)
WBC: 6.3 K/uL (ref 4.0–10.5)
nRBC: 0 % (ref 0.0–0.2)

## 2023-12-13 LAB — COMPREHENSIVE METABOLIC PANEL WITH GFR
ALT: 13 U/L (ref 0–44)
AST: 21 U/L (ref 15–41)
Albumin: 3.3 g/dL — ABNORMAL LOW (ref 3.5–5.0)
Alkaline Phosphatase: 70 U/L (ref 38–126)
Anion gap: 8 (ref 5–15)
BUN: 11 mg/dL (ref 8–23)
CO2: 24 mmol/L (ref 22–32)
Calcium: 8.3 mg/dL — ABNORMAL LOW (ref 8.9–10.3)
Chloride: 101 mmol/L (ref 98–111)
Creatinine, Ser: 1.05 mg/dL (ref 0.61–1.24)
GFR, Estimated: >60 mL/min (ref >60)
Glucose, Bld: 115 mg/dL — ABNORMAL HIGH (ref 70–99)
Potassium: 3.7 mmol/L (ref 3.5–5.1)
Sodium: 133 mmol/L — ABNORMAL LOW (ref 135–145)
Total Bilirubin: 0.9 mg/dL (ref 0.0–1.2)
Total Protein: 5.2 g/dL — ABNORMAL LOW (ref 6.5–8.1)

## 2023-12-13 LAB — PHOSPHORUS: Phosphorus: 3.9 mg/dL (ref 2.5–4.6)

## 2023-12-13 LAB — MAGNESIUM: Magnesium: 1.2 mg/dL — ABNORMAL LOW (ref 1.7–2.4)

## 2023-12-13 MED ORDER — OXYCODONE HCL 5 MG PO TABS: 10.0000 mg | ORAL_TABLET | ORAL | Status: DC | PRN

## 2023-12-13 MED ORDER — MAGNESIUM SULFATE 4 GM/100ML IV SOLN
4.0000 g | Freq: Once | INTRAVENOUS | Status: DC
  Filled 2023-12-13: qty 100

## 2023-12-13 MED ORDER — MAGNESIUM OXIDE -MG SUPPLEMENT 400 (240 MG) MG PO TABS
400.0000 mg | ORAL_TABLET | Freq: Every day | ORAL | Status: DC
  Administered 2023-12-13: 400 mg via ORAL
  Filled 2023-12-13: qty 1

## 2023-12-13 MED ORDER — OXYCODONE HCL 10 MG PO TABS
10.0000 mg | ORAL_TABLET | Freq: Four times a day (QID) | ORAL | 0 refills | Status: AC | PRN
  Filled 2023-12-13: qty 20, 5d supply, fill #0

## 2023-12-13 NOTE — Discharge Instructions (Signed)
 Femoral Site Care This sheet gives you information about how to care for yourself after your procedure. Your health care provider may also give you more specific instructions. If you have problems or questions, contact your health care provider. What can I expect after the procedure? After the procedure, it is common to have: Bruising that usually fades within 1-2 weeks. Tenderness at the site. Follow these instructions at home: Wound care Follow instructions from your health care provider about how to take care of your insertion site. Make sure you: Wash your hands with soap and water before you change your bandage (dressing). If soap and water are not available, use hand sanitizer. Change your dressing as directed- pressure dressing removed 24 hours post-procedure (and switch for bandaid), bandaid removed 72 hours post-procedure Do not take baths, swim, or use a hot tub for 7 days post-procedure. You may shower 48 hours after the procedure or as told by your health care provider. Gently wash the site with plain soap and water. Pat the area dry with a clean towel. Do not rub the site. This may cause bleeding. Check your site every day for signs of infection. Check for: Redness, swelling, or pain. Fluid or blood. Warmth. Pus or a bad smell. Activity Do not stoop, bend, or lift anything that is heavier than 10 lb (4.5 kg) for 2 weeks post-procedure. Do not drive self for 2 weeks post-procedure. Contact a health care provider if you have: A fever or chills. You have redness, swelling, or pain around your insertion site. Get help right away if: The catheter insertion area swells very fast. You pass out. You suddenly start to sweat or your skin gets clammy. The catheter insertion area is bleeding, and the bleeding does not stop when you hold steady pressure on the area. The area near or just beyond the catheter insertion site becomes pale, cool, tingly, or numb. These symptoms may  represent a serious problem that is an emergency. Do not wait to see if the symptoms will go away. Get medical help right away. Call your local emergency services (911 in the [place] Do not drive yourself to the hospital.  This information is not intended to replace advice given to you by your health care provider. Make sure you discuss any questions you have with your health care provider. Document Revised: 03/05/2017 Document Reviewed: 03/05/2017 Elsevier Patient Education  2020 [employer].

## 2023-12-13 NOTE — Progress Notes (Signed)
 Noah Griffin to be D/C'd Home per MD order.  Discussed with the patient and all questions fully answered.  VSS, Skin clean, dry and intact without evidence of skin break down, no evidence of skin tears noted. IV catheter discontinued intact. Site without signs and symptoms of complications. Dressing and pressure applied.  An After Visit Summary was printed and given to the patient. Patient prescriptions for be picked up by discharge lounge tech.  D/c education completed with patient/family including follow up instructions, medication list, d/c activities limitations if indicated, with other d/c instructions as indicated by MD - patient able to verbalize understanding, all questions fully answered.   Patient instructed to return to ED, call 911, or call MD for any changes in condition.   Patient escorted via [facility] to discharge lounge to await ride home.  [name] 12/13/2023 9:55 AM

## 2023-12-13 NOTE — Discharge Summary (Signed)
 Physician Discharge Summary  Noah Griffin FMW:[id number] DOB: Feb 10, 1956 DOA: 12/12/2023  PCP: Rosamond Leta NOVAK, MD  Admit date: 12/12/2023 Discharge date: 12/13/2023  Admitted From: home Disposition:  home  Recommendations for Outpatient Follow-up:  Follow up with PCP in 1-2 weeks Please obtain BMP/CBC in one week  Home Health: none Equipment/Devices: none  Discharge Condition: stable CODE STATUS: Full code Diet Orders (From admission, onward)     Start     Ordered   12/12/23 2054  Diet Heart Room service appropriate? Yes; Fluid consistency: Thin  Diet effective now       Question Answer Comment  Room service appropriate? Yes   Fluid consistency: Thin      12/12/23 2055            HPI: Per admitting MD, Noah Griffin is a 68 y.o. male with medical history significant of chronic pancreatitis history of gastric varices, hypertension alcohol abuse in remission. Presented with status postprocedure Patient with history of alcoholic pancreatitis with gastrosplenic varices today he has undergone partial splenic artery embolization procedure was successful but was somewhat prolonged complicated by abdominal pain postprocedure interventional radiology requested hospitalist to observe patient overnight to help with pain management nausea and vomiting and symptom control    Hospital Course / Discharge diagnoses: Principal problem Abdominal pain-patient was monitored overnight due to postprocedural abdominal pain, postembolization by IR.  He underwent partial subselective splenic arterial embolization.  Overnight, he did well, pain is better this morning, eating and asking to go home.  He will be discharged in stable condition on a short course of oxycodone  Active problems Underlying liver disease, prior EtOH use-overall stable, status post procedures as above Hypomagnesemia-magnesium replenished prior to discharge.  He is taking magnesium supplementation at home Anemia,  thrombocytopenia-likely in the setting of underlying liver disease Essential hypertension-continue home medications Chronic pancreatitis-no acute issues, supportive management  Sepsis ruled out   Discharge Instructions   Allergies as of 12/13/2023   No Known Allergies      Medication List     TAKE these medications    amLODipine 10 MG tablet Commonly known as: NORVASC Take 10 mg by mouth 2 (two) times daily.   baclofen 10 MG tablet Commonly known as: LIORESAL Take 10 mg by mouth daily.   clonazePAM 0.5 MG tablet Commonly known as: KLONOPIN Take 0.5 mg by mouth at bedtime.   escitalopram 20 MG tablet Commonly known as: LEXAPRO Take 20 mg by mouth daily.   Levothyroxine Sodium 200 MCG/ML Soln Take 250 mcg by mouth daily before breakfast.   Oxycodone HCl 10 MG Tabs Take 1 tablet (10 mg total) by mouth every 6 (six) hours as needed for severe pain (pain score 7-10) or breakthrough pain.   pantoprazole 40 MG tablet Commonly known as: PROTONIX Take 40 mg by mouth daily.   traZODone 50 MG tablet Commonly known as: DESYREL Take 100 mg by mouth at bedtime.   valsartan 160 MG tablet Commonly known as: DIOVAN Take 160 mg by mouth daily. What changed: how much to take        Follow-up Information     Vyas, Dhruv B, MD Follow up in 1 week(s).   Specialty: Internal Medicine Contact information: [address] [place] [address] [phone]                 Consultations: IR  Procedures/Studies:  IR Radiologist Eval & Mgmt Result Date: 12/03/2023 EXAM: IR General TECHNIQUE: Telephone discussion. FINDINGS: A telephone  discussion was held with the patient. The patient is planning to see Dr. Castaneda Mayorga soon. 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 The patient is motivated to proceed with partial splenic artery  embolization to decrease splenic venous flow through the varices to stabilize or shrink varices. The technique and technical details of partial splenic arterial coil embolization, anticipated benefits, possible risks, and complications were reviewed. It was discussed that this could likely be performed as outpatient procedures, possibly with overnight observation. Potential postop pain and pain control options were discussed. The patient seemed to understand and asked appropriate questions which were answered. Input from Dr. Eartha Flavors regarding current gastric variceal size and rupture risk will be awaited to inform the timing of any intervention. The patient has our clinic number for any other questions or concerns. IMPRESSION: 1. 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 2. Plan: Await input from Dr. Eartha Flavors regarding current gastric variceal size and rupture risk to inform timing of any intervention. Electronically signed by: Katheleen Faes MD 12/03/2023 01:22 PM EDT RP Workstation: [id number]     Subjective: - no chest pain, shortness of breath, no abdominal pain, nausea or vomiting.   Discharge Exam: BP 116/65 (BP Location: Right Arm)   Pulse (!) 43   Temp 98.4 F (36.9 C)   Resp 17   Ht 5' 10 (1.778 m)   Wt 69.4 kg   SpO2 98%   BMI 21.95 kg/m   General: Pt is alert, awake, not in acute distress Cardiovascular: RRR, S1/S2 +, no rubs, no gallops Respiratory: CTA bilaterally, no wheezing, no rhonchi Abdominal: Soft, NT, ND, bowel sounds + Extremities: no edema, no cyanosis    The results of significant diagnostics from this hospitalization (including imaging, microbiology, ancillary and laboratory) are listed below for reference.     Microbiology: No results found for this or  any previous visit (from the past 240 hours).   Labs: Basic Metabolic Panel: Recent Labs  Lab 12/12/23 1223 12/13/23 0513  NA 137 133*  K 4.6 3.7  CL 104 101  CO2 25 24  GLUCOSE 108* 115*  BUN 11 11  CREATININE 1.16 1.05  CALCIUM 8.6* 8.3*  MG  --  1.2*  PHOS  --  3.9   Liver Function Tests: Recent Labs  Lab 12/13/23 0513  AST 21  ALT 13  ALKPHOS 70  BILITOT 0.9  PROT 5.2*  ALBUMIN 3.3*   CBC: Recent Labs  Lab 12/12/23 2111 12/13/23 0513  WBC 9.2 6.3  NEUTROABS 5.7  --   HGB 11.6* 9.7*  HCT 34.7* 27.9*  MCV 96.4 93.9  PLT 139* 96*   CBG: No results for input(s): GLUCAP in the last 168 hours. Hgb A1c No results for input(s): HGBA1C in the last 72 hours. Lipid Profile No results for input(s): CHOL, HDL, LDLCALC, TRIG, CHOLHDL, LDLDIRECT in the last 72 hours. Thyroid function studies No results for input(s): TSH, T4TOTAL, T3FREE, THYROIDAB in the last 72 hours.  Invalid input(s): FREET3 Urinalysis No results found for: COLORURINE, APPEARANCEUR, LABSPEC, PHURINE, GLUCOSEU, HGBUR, BILIRUBINUR, KETONESUR, PROTEINUR, UROBILINOGEN, NITRITE, LEUKOCYTESUR  FURTHER DISCHARGE INSTRUCTIONS:   Get Medicines reviewed and adjusted: Please take all your medications with you for your next visit with your Primary MD   Laboratory/radiological data: Please request  your Primary MD to go over all hospital tests and procedure/radiological results at the follow up, please ask your Primary MD to get all Hospital records sent to his/her office.   In some cases, they will be blood work, cultures and biopsy results pending at the time of your discharge. Please request that your primary care M.D. goes through all the records of your hospital data and follows up on these results.   Also Note the following: If you experience worsening of your admission symptoms, develop shortness of breath, life threatening emergency, suicidal or  homicidal thoughts you must seek medical attention immediately by calling 911 or calling your MD immediately  if symptoms less severe.   You must read complete instructions/literature along with all the possible adverse reactions/side effects for all the Medicines you take and that have been prescribed to you. Take any new Medicines after you have completely understood and accpet all the possible adverse reactions/side effects.    Do not drive when taking Pain medications or sleeping medications (Benzodaizepines)   Do not take more than prescribed Pain, Sleep and Anxiety Medications. It is not advisable to combine anxiety,sleep and pain medications without talking with your primary care practitioner   Special Instructions: If you have smoked or chewed Tobacco  in the last 2 yrs please stop smoking, stop any regular Alcohol  and or any Recreational drug use.   Wear Seat belts while driving.   Please note: You were cared for by a hospitalist during your hospital stay. Once you are discharged, your primary care physician will handle any further medical issues. Please note that NO REFILLS for any discharge medications will be authorized once you are discharged, as it is imperative that you return to your primary care physician (or establish a relationship with a primary care physician if you do not have one) for your post hospital discharge needs so that they can reassess your need for medications and monitor your lab values.  Time coordinating discharge: 35 minutes  SIGNED:  Nilda Fendt, MD, PhD 12/13/2023, 8:51 AM

## 2023-12-13 NOTE — Progress Notes (Addendum)
 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 Patient was referred to IR after and underwent left renal venogram with BRTO on 10/26/23, which showed no communication to gastric varices to allow BRTO. Pt then met with Dr. Johann in clinic on 11/02/23 where they discussed alternative options, including partial splenic artery emobolization vs watchful waiting vs splenectomy vs trans splenic venous embolization. These were then discussed again with GI physician Dr. Eartha Angelia. Decision after these consultations was to proceed with partial splenic artery embolization with IR.  Patient underwent procedure yesterday with Dr. Johann and was subsequently admitted for observation overnight.  He is ambulating independently and dressed upon entering his room this AM, has had a BM and ate part of his dinner last night without N/V. He notes intermittent 8/10 abd pain that he feels was well managed by IV fentanyl , states that he never felt that pain was overwhelming, mostly requested to keeping himself out of pain valley.   Allergies Reviewed:  Patient has no known allergies.   Patient is Full Code  Past Medical History:  Diagnosis Date   Anxiety    Esophageal varices (HCC)    Hypertension    Pancreatitis    Thyroid condition     Past Surgical History:  Procedure Laterality Date   COLONOSCOPY N/A 11/20/2013   Procedure: COLONOSCOPY;  Surgeon: Claudis RAYMOND Rivet, MD;  Location: [facility];  Service: Endoscopy;  Laterality: N/A;  730-rescheduled same time Ann notified pt   ESOPHAGOGASTRODUODENOSCOPY N/A 08/24/2023   Procedure: EGD (ESOPHAGOGASTRODUODENOSCOPY);  Surgeon:  Eartha Angelia, Sieving, MD;  Location: [facility];  Service: Gastroenterology;  Laterality: N/A;  900AM, ASA 1-2   HERNIA REPAIR     IR ANGIOGRAM VISCERAL SELECTIVE  12/12/2023   IR EMBO ART  VEN HEMORR LYMPH EXTRAV  INC GUIDE ROADMAPPING  10/26/2023   IR EMBO ARTERIAL NOT HEMORR HEMANG INC GUIDE ROADMAPPING  12/12/2023   IR RADIOLOGIST EVAL & MGMT  10/03/2023   IR RADIOLOGIST EVAL & MGMT  11/02/2023   IR RADIOLOGIST EVAL & MGMT  11/23/2023   IR US  GUIDE VASC ACCESS RIGHT  10/26/2023   IR US  GUIDE VASC ACCESS RIGHT  12/12/2023   IR VENOGRAM RENAL UNI LEFT  10/26/2023      Medications: Prior to Admission medications   Medication Sig Start Date End Date Taking? Authorizing Provider  amLODipine (NORVASC) 10 MG tablet Take 10 mg by mouth 2 (two) times daily.    [provider]  baclofen (LIORESAL) 10 MG tablet Take 10 mg by mouth daily.    [provider]  clonazePAM (KLONOPIN) 0.5 MG tablet Take 0.5 mg by mouth at bedtime.    [provider]  escitalopram (LEXAPRO) 20 MG tablet Take 20 mg by mouth daily.    [provider]  Levothyroxine Sodium 200 MCG/ML SOLN Take 250 mcg by mouth daily before breakfast.    [provider]  Oxycodone HCl 10 MG TABS Take 1 tablet (10 mg total) by mouth every 6 (six) hours as needed for severe pain (pain score 7-10) or breakthrough pain. 12/13/23   Gherghe, Costin M, MD  pantoprazole (PROTONIX) 40 MG tablet Take 40 mg by mouth daily.    [provider]  traZODone (DESYREL) 50 MG tablet Take 100 mg by mouth at bedtime.     [provider]  valsartan (DIOVAN) 160 MG tablet Take 160 mg by mouth daily. Patient taking differently: Take 320 mg by mouth daily.    [provider]     Family History  Problem Relation Age of Onset   Parkinsonism Mother    Pancreatic cancer Father    Healthy Son     Social History   Socioeconomic History   Marital status: Married    Spouse name: Not on file    Number of children: Not on file   Years of education: Not on file   Highest education level: Not on file  Occupational History   Not on file  Tobacco Use   Smoking status: Former    Types: Cigars   Smokeless tobacco: Never   Tobacco comments:    patient smokes 1 cigar a day  Vaping Use   Vaping status: Never Used  Substance and Sexual Activity   Alcohol use: No    Comment: Quit using alcohol 3-4 years ago following acute Pancreatitis Attack   Drug use: No   Sexual activity: Not on file  Other Topics Concern   Not on file  Social History Narrative   Not on file   Social Drivers of Health   Financial Resource Strain: Low Risk  (06/06/2023)   Received from [facility]   Overall Financial Resource Strain (CARDIA)    Difficulty of Paying Living Expenses: Not very hard  Food Insecurity: No Food Insecurity (12/12/2023)   Hunger Vital Sign    Worried About Running Out of Food in the Last Year: Never true    Ran Out of Food in the Last Year: Never true  Transportation Needs: No Transportation Needs (12/12/2023)   PRAPARE - Administrator, Civil Service (Medical): No    Lack of Transportation (Non-Medical): No  Physical Activity: Not on file  Stress: Not on file  Social Connections: Moderately Integrated (12/12/2023)   Social Connection and Isolation Panel    Frequency of Communication with Friends and Family: Three times a week    Frequency of Social Gatherings with Friends and Family: Three times a week    Attends Religious Services: 1 to 4 times per year    Active Member of Clubs or Organizations: No    Attends Banker Meetings: Never    Marital Status: Married     Review of Systems: A 12 point ROS discussed and pertinent positives are indicated in the HPI above.  All other systems are negative.    Vital Signs: BP 116/65 (BP Location: Right Arm)   Pulse (!) 43   Temp 98.4 F (36.9 C)   Resp 17   Ht 5' 10 (1.778 m)   Wt 153 lb (69.4 kg)    SpO2 98%   BMI 21.95 kg/m    Physical Exam Constitutional:      Appearance: Normal appearance.  HENT:     Mouth/Throat:     Mouth: Mucous membranes are moist.     Pharynx: Oropharynx is clear.  Cardiovascular:     Pulses: Normal pulses.  Pulmonary:     Effort: Pulmonary effort is normal.  Abdominal:     General: There is no distension.     Palpations: Abdomen is soft.  Skin:    General: Skin is warm and dry.     Comments: R groin puncture site is appropriately  tender, dressing C/D/I, no bruising or swelling identified  Neurological:     Mental Status: He is alert and oriented to person, place, and time.  Psychiatric:        Mood and Affect: Mood normal.        Behavior: Behavior normal.     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 No clinical evidence of cirrhosis or portal venous hypertension. No significant splenorenal shunt. Partial splenic embolization planned to decrease flow through the gastric collateral outflow channels, minimize bleeding risk. EXAM: SUB SELECTIVE SPLENIC ARTERIOGRAM ARTERIAL COIL EMBOLIZATION ULTRASOUND GUIDANCE FOR VASCULAR ACCESS MEDICATIONS: cefazolin 2 G. The antibiotic was administered within 1 hour of the procedure ANESTHESIA/SEDATION: Moderate (conscious) sedation was employed during this procedure. A total of Versed  8 mg and dilaudid 6 mg was administered intravenously by the radiology nurse. Total intra-service moderate Sedation Time: 210 minutes. The patient's level of consciousness and vital signs were monitored continuously by radiology nursing throughout the procedure under my direct supervision. CONTRAST:  165 mL Omnipaque  300 FLUOROSCOPY: Radiation Exposure Index (as provided by the fluoroscopic device): 524 mGy Kerma COMPLICATIONS: None immediate. PROCEDURE: Informed consent was  obtained from the patient following explanation of the procedure, risks, benefits and alternatives. The patient understands, agrees and consents for the procedure. All questions were addressed. A time out was performed prior to the initiation of the procedure. Maximal barrier sterile technique utilized including caps, mask, sterile gowns, sterile gloves, large sterile drape, hand hygiene, and Betadine prep. Under real-time ultrasound guidance, micropuncture access of the right common femoral artery was achieved. Micropuncture exchanged over a Bentson wire for a 5 [place] vascular sheath, through which a 5 [place] C2 catheter was advanced to selectively catheterize the celiac axis. However, distal access to the splenic artery could not be achieved. After using a variety of catheter and guidewire combinations, the sheath was exchanged for a long 6 [place] vascular sheath. This stabilized 5 French catheter access to the celiac axis and allowed advancement of the high-flow Renegade microcatheter into the splenic artery for splenic arteriography. However, this platform was inadequate to support distal microcatheter embolization. Subsequently, the sheath was exchanged for a long directional 7 [place] vascular sheath which achieved durable access into the celiac axis. The 5 French catheter was then advanced into the mid splenic artery. Splenic arteriography obtained. Coaxial lantern microcatheter was advanced and coil embolization of 4 dominant third-order branches was performed, with intermittent arteriography to assess parenchymal perfusion. At the conclusion, the microcatheter was removed and final splenic arteriography was performed, demonstrating approximately 50% decrease in overall splenic parenchymal perfusion, continued patency of the main splenic artery. The catheter and sheath were then removed and hemostasis achieved at the site with the aid of Celt device under ultrasound guidance. The patient tolerated the  procedure well. No immediate complication. IMPRESSION: 1. Patent celiac axis and splenic artery. 2. Technically successful embolization of four third-order splenic artery branches, reducing parenchymal perfusion by ~50%. Electronically Signed   By: JONETTA Faes M.D.   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 No clinical evidence of cirrhosis or portal venous hypertension. No significant splenorenal shunt. Partial splenic embolization planned to decrease flow through the gastric collateral outflow channels, minimize bleeding risk. EXAM: SUB SELECTIVE SPLENIC ARTERIOGRAM ARTERIAL COIL  EMBOLIZATION ULTRASOUND GUIDANCE FOR VASCULAR ACCESS MEDICATIONS: cefazolin 2 G. The antibiotic was administered within 1 hour of the procedure ANESTHESIA/SEDATION: Moderate (conscious) sedation was employed during this procedure. A total of Versed  8 mg and dilaudid 6 mg was administered intravenously by the radiology nurse. Total intra-service moderate Sedation Time: 210 minutes. The patient's level of consciousness and vital signs were monitored continuously by radiology nursing throughout the procedure under my direct supervision. CONTRAST:  165 mL Omnipaque  300 FLUOROSCOPY: Radiation Exposure Index (as provided by the fluoroscopic device): 524 mGy Kerma COMPLICATIONS: None immediate. PROCEDURE: Informed consent was obtained from the patient following explanation of the procedure, risks, benefits and alternatives. The patient understands, agrees and consents for the procedure. All questions were addressed. A time out was performed prior to the initiation of the procedure. Maximal barrier sterile technique utilized including caps, mask, sterile gowns, sterile gloves, large sterile drape, hand hygiene, and Betadine prep. Under real-time ultrasound guidance, micropuncture access of the right  common femoral artery was achieved. Micropuncture exchanged over a Bentson wire for a 5 [place] vascular sheath, through which a 5 [place] C2 catheter was advanced to selectively catheterize the celiac axis. However, distal access to the splenic artery could not be achieved. After using a variety of catheter and guidewire combinations, the sheath was exchanged for a long 6 [place] vascular sheath. This stabilized 5 French catheter access to the celiac axis and allowed advancement of the high-flow Renegade microcatheter into the splenic artery for splenic arteriography. However, this platform was inadequate to support distal microcatheter embolization. Subsequently, the sheath was exchanged for a long directional 7 [place] vascular sheath which achieved durable access into the celiac axis. The 5 French catheter was then advanced into the mid splenic artery. Splenic arteriography obtained. Coaxial lantern microcatheter was advanced and coil embolization of 4 dominant third-order branches was performed, with intermittent arteriography to assess parenchymal perfusion. At the conclusion, the microcatheter was removed and final splenic arteriography was performed, demonstrating approximately 50% decrease in overall splenic parenchymal perfusion, continued patency of the main splenic artery. The catheter and sheath were then removed and hemostasis achieved at the site with the aid of Celt device under ultrasound guidance. The patient tolerated the procedure well. No immediate complication. IMPRESSION: 1. Patent celiac axis and splenic artery. 2. Technically successful embolization of four third-order splenic artery branches, reducing parenchymal perfusion by ~50%. Electronically Signed   By: JONETTA Faes M.D.   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 No clinical evidence of  cirrhosis or portal venous hypertension. No significant splenorenal shunt. Partial splenic embolization planned to decrease flow through the gastric collateral outflow channels, minimize bleeding risk. EXAM: SUB SELECTIVE SPLENIC ARTERIOGRAM ARTERIAL COIL EMBOLIZATION ULTRASOUND GUIDANCE FOR VASCULAR ACCESS MEDICATIONS: cefazolin 2 G. The antibiotic was administered within 1 hour of the procedure ANESTHESIA/SEDATION: Moderate (conscious) sedation was employed during this procedure. A total of Versed  8 mg and dilaudid 6 mg was administered intravenously by the radiology nurse. Total intra-service moderate Sedation Time: 210 minutes. The patient's level of consciousness and vital signs were monitored continuously by radiology nursing throughout the procedure under my direct supervision. CONTRAST:  165 mL Omnipaque  300 FLUOROSCOPY: Radiation Exposure Index (as provided by the fluoroscopic device): 524 mGy Kerma COMPLICATIONS: None immediate. PROCEDURE: Informed consent was obtained from the patient following explanation of the procedure, risks, benefits and alternatives. The patient understands, agrees and consents  for the procedure. All questions were addressed. A time out was performed prior to the initiation of the procedure. Maximal barrier sterile technique utilized including caps, mask, sterile gowns, sterile gloves, large sterile drape, hand hygiene, and Betadine prep. Under real-time ultrasound guidance, micropuncture access of the right common femoral artery was achieved. Micropuncture exchanged over a Bentson wire for a 5 [place] vascular sheath, through which a 5 [place] C2 catheter was advanced to selectively catheterize the celiac axis. However, distal access to the splenic artery could not be achieved. After using a variety of catheter and guidewire combinations, the sheath was exchanged for a long 6 [place] vascular sheath. This stabilized 5 French catheter access to the celiac axis and allowed advancement  of the high-flow Renegade microcatheter into the splenic artery for splenic arteriography. However, this platform was inadequate to support distal microcatheter embolization. Subsequently, the sheath was exchanged for a long directional 7 [place] vascular sheath which achieved durable access into the celiac axis. The 5 French catheter was then advanced into the mid splenic artery. Splenic arteriography obtained. Coaxial lantern microcatheter was advanced and coil embolization of 4 dominant third-order branches was performed, with intermittent arteriography to assess parenchymal perfusion. At the conclusion, the microcatheter was removed and final splenic arteriography was performed, demonstrating approximately 50% decrease in overall splenic parenchymal perfusion, continued patency of the main splenic artery. The catheter and sheath were then removed and hemostasis achieved at the site with the aid of Celt device under ultrasound guidance. The patient tolerated the procedure well. No immediate complication. IMPRESSION: 1. Patent celiac axis and splenic artery. 2. Technically successful embolization of four third-order splenic artery branches, reducing parenchymal perfusion by ~50%. Electronically Signed   By: JONETTA Faes M.D.   On: 12/13/2023 09:06   IR Radiologist Eval & Mgmt Result Date: 12/03/2023 EXAM: IR General TECHNIQUE: Telephone discussion. FINDINGS: A telephone discussion was held with the patient. The patient is planning to see Dr. Castaneda Mayorga soon. 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 The patient is motivated to proceed with partial splenic artery embolization to decrease splenic venous flow through the varices to stabilize or shrink varices. The technique and technical details of partial splenic arterial coil embolization, anticipated benefits, possible risks, and  complications were reviewed. It was discussed that this could likely be performed as outpatient procedures, possibly with overnight observation. Potential postop pain and pain control options were discussed. The patient seemed to understand and asked appropriate questions which were answered. Input from Dr. Eartha Flavors regarding current gastric variceal size and rupture risk will be awaited to inform the timing of any intervention. The patient has our clinic number for any other questions or concerns. IMPRESSION: 1. 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 2. Plan: Await input from Dr. Eartha Flavors regarding current gastric variceal size and rupture risk to inform timing of any intervention. Electronically signed by: Katheleen Faes MD 12/03/2023 01:22 PM EDT RP Workstation: [id number]    Labs:  CBC: Recent Labs    10/26/23 0656 12/12/23 2111 12/13/23 0513  WBC 4.9 9.2 6.3  HGB 11.1* 11.6* 9.7*  HCT 33.2* 34.7* 27.9*  PLT 122* 139* 96*    COAGS: Recent Labs    10/26/23 0656 12/12/23 1223  INR 1.0 1.0  APTT  --  30    BMP: Recent Labs    10/26/23 0656 12/12/23 1223 12/13/23 0513  NA 137 137 133*  K 4.2 4.6 3.7  CL 105 104 101  CO2 26 25 24   GLUCOSE 119* 108* 115*  BUN 10 11 11   CALCIUM 8.9 8.6* 8.3*  CREATININE 1.11 1.16 1.05  GFRNONAA >60 >60 >60    LIVER FUNCTION TESTS: Recent Labs    12/13/23 0513  BILITOT 0.9  AST 21  ALT 13  ALKPHOS 70  PROT 5.2*  ALBUMIN 3.3*    TUMOR MARKERS: No results for input(s): AFPTM, CEA, CA199, CHROMGRNA in the last 8760 hours.  Assessment and Plan:  Gastrosplenic varices - s/p partial subselective splenic artery embolization, approx 50% parenchyma  VSS Hgb 9.7 from 11.6, discussed with Dr. Johann, not  unexpected Puncture site looks great. Patient aware of post puncture site care/restrictions Pain plan for NSAIDS with oxy PO for breakthrough already discussed with hospitalist prior to my rounding, patient agrees/feels comfortable with this plan He has had BM, eaten, and he is not exhibiting any other concerning findings at this time.   IR agrees with DC this AM per hospitalist service.   Order placed for schedulers to arrange 2-4 wk f/u at clinic with Dr. Johann. May be virtual if better for patient.   Thank you for allowing our service to participate in Delvis Kau 's care.    Electronically Signed: Laymon Coast, NP   12/13/2023, 9:47 AM     I spent a total of 20 Minutes    in face to face in clinical consultation, greater than 50% of which was counseling/coordinating care for s/p splenic artery embo.   (A copy of this note was sent to the referring provider and the time of visit.)

## 2023-12-14 LAB — MISC LABCORP TEST (SEND OUT): Labcorp test code: 83935

## 2023-12-17 ENCOUNTER — Encounter: Payer: Self-pay | Admitting: Interventional Radiology

## 2024-01-02 ENCOUNTER — Telehealth (INDEPENDENT_AMBULATORY_CARE_PROVIDER_SITE_OTHER): Payer: Self-pay | Admitting: Gastroenterology

## 2024-01-02 NOTE — Telephone Encounter (Signed)
 Pt contacted office and states that he has seen Dr.Hassell twice and had the procedures to cut down the varices. Pt states he is needing to know what next steps are and how does he know the procedure worked. Pt states he was referred back to us  by Dr.Hassell. please advise. Thank you  [phone]

## 2024-01-03 NOTE — Telephone Encounter (Signed)
 Can we make this pt an appt please? Thank you!

## 2024-01-10 ENCOUNTER — Encounter (INDEPENDENT_AMBULATORY_CARE_PROVIDER_SITE_OTHER): Payer: Self-pay | Admitting: Gastroenterology

## 2024-01-25 ENCOUNTER — Ambulatory Visit
Admission: RE | Admit: 2024-01-25 | Discharge: 2024-01-25 | Disposition: A | Discharge status: Home / Self Care | Source: Ambulatory Visit

## 2024-01-25 DIAGNOSIS — I82890 Acute embolism and thrombosis of other specified veins: Secondary | ICD-10-CM

## 2024-01-25 NOTE — Progress Notes (Signed)
 Patient ID: Noah Griffin, male   DOB: 05/08/1955, 68 y.o.   MRN: [id number]       Chief Complaint: Patient was seen in consultation today for f/u partial splenic embolization at the request of Huneycutt,Brittany  Referring Physician(s): Eartha Angelia Sieving, MD   History of Present Illness: Noah Griffin is a 68 y.o. 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 11/18/2007 CT showed  Probable occlusion of the splenic vein with peri gastric collateralization.  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  04/13/23 CT at presentation of acute abdominal pain: Complete pancreatic atrophy. And extensive gastrosplenic, perigastric, and to a lesser extent Mesenteric Venous Varices. Superimposed confluent Mesenteric Edema affecting a large part of the Small Bowel mesentery.    no acute mesenteric vascular occlusion is identified.  SYmptoms resolved with observation. 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 There were no stigmata of recent bleeding. They were medium in largest diameter.  No h/o hematemesis. No abd pain or tenderness.  10/26/23 Left renal venogram with attempted BRTO of gastric varices, however no splenorenal shunt was demonstrated so procedure terminated. He did well post venogram, no access site issues. 12/12/2023 Fluoro and US  guided partial subselective splenic arterial embolization approx 50% parenchyma   He had some LUQ soreness in the days post procedure. He had some fatigue. Post embo symptoms have resolved.  Past Medical History:  Diagnosis Date   Anxiety    Esophageal varices (HCC)     Hypertension    Pancreatitis    Thyroid condition     Past Surgical History:  Procedure Laterality Date   COLONOSCOPY N/A 11/20/2013   Procedure: COLONOSCOPY;  Surgeon: Claudis RAYMOND Rivet, MD;  [facility];  Service: Endoscopy;  Laterality: N/A;  730-rescheduled same time Ann notified pt   ESOPHAGOGASTRODUODENOSCOPY N/A 08/24/2023   Procedure: EGD (ESOPHAGOGASTRODUODENOSCOPY);  Surgeon: Eartha Angelia, Sieving, MD;  [facility];  Service: Gastroenterology;  Laterality: N/A;  900AM, ASA 1-2   HERNIA REPAIR     IR ANGIOGRAM VISCERAL SELECTIVE  12/12/2023   IR EMBO ART  VEN HEMORR LYMPH EXTRAV  INC GUIDE ROADMAPPING  10/26/2023   IR EMBO ARTERIAL NOT HEMORR HEMANG INC GUIDE ROADMAPPING  12/12/2023   IR RADIOLOGIST EVAL & MGMT  10/03/2023   IR RADIOLOGIST EVAL & MGMT  11/02/2023   IR RADIOLOGIST EVAL & MGMT  11/23/2023   IR US  GUIDE VASC ACCESS RIGHT  10/26/2023   IR US  GUIDE VASC ACCESS RIGHT  12/12/2023   IR VENOGRAM RENAL UNI LEFT  10/26/2023    Allergies: Patient has no known allergies.  Medications: Prior to Admission medications   Medication Sig Start Date End Date Taking? Authorizing Provider  amLODipine  (NORVASC ) 10 MG tablet Take 10 mg by mouth 2 (two) times daily.    [provider]  baclofen (LIORESAL) 10 MG tablet Take 10 mg by mouth daily.    [provider]  clonazePAM  (KLONOPIN ) 0.5 MG tablet Take 0.5 mg by mouth at bedtime.    [provider]  escitalopram  (LEXAPRO ) 20 MG tablet Take 20 mg by mouth daily.    [provider]  Levothyroxine  Sodium 200 MCG/ML  SOLN Take 250 mcg by mouth daily before breakfast.    [provider]  Oxycodone  HCl 10 MG TABS Take 1 tablet (10 mg total) by mouth every 6 (six) hours as needed for severe pain (pain score 7-10) or breakthrough pain. 12/13/23   Gherghe, Costin M, MD  pantoprazole  (PROTONIX ) 40 MG tablet Take 40 mg by mouth daily.    [provider]  traZODone   (DESYREL ) 50 MG tablet Take 100 mg by mouth at bedtime.     [provider]  valsartan (DIOVAN) 160 MG tablet Take 160 mg by mouth daily. Patient taking differently: Take 320 mg by mouth daily.    [provider]     Family History  Problem Relation Age of Onset   Parkinsonism Mother    Pancreatic cancer Father    Healthy Son     Social History   Socioeconomic History   Marital status: Married    Spouse name: Not on file   Number of children: Not on file   Years of education: Not on file   Highest education level: Not on file  Occupational History   Not on file  Tobacco Use   Smoking status: Former    Types: Cigars   Smokeless tobacco: Never   Tobacco comments:    patient smokes 1 cigar a day  Vaping Use   Vaping status: Never Used  Substance and Sexual Activity   Alcohol use: No    Comment: Quit using alcohol 3-4 years ago following acute Pancreatitis Attack   Drug use: No   Sexual activity: Not on file  Other Topics Concern   Not on file  Social History Narrative   Not on file   Social Drivers of Health   Financial Resource Strain: Low Risk (06/06/2023)   Received from [facility]   Overall Financial Resource Strain (CARDIA)    Difficulty of Paying Living Expenses: Not very hard  Food Insecurity: No Food Insecurity (12/12/2023)   Hunger Vital Sign    Worried About Running Out of Food in the Last Year: Never true    Ran Out of Food in the Last Year: Never true  Transportation Needs: No Transportation Needs (12/12/2023)   PRAPARE - Administrator, Civil Service (Medical): No    Lack of Transportation (Non-Medical): No  Physical Activity: Not on file  Stress: Not on file  Social Connections: Moderately Integrated (12/12/2023)   Social Connection and Isolation Panel    Frequency of Communication with Friends and Family: Three times a week    Frequency of Social Gatherings with Friends and Family: Three times a week    Attends  Religious Services: 1 to 4 times per year    Active Member of Clubs or Organizations: No    Attends Banker Meetings: Never    Marital Status: Married    ECOG Status: 0 - Asymptomatic  Review of Systems: A 12 point ROS discussed and pertinent positives are indicated in the HPI above.  All other systems are negative.  Review of Systems  Vital Signs: BP (!) 149/82 (BP Location: Left Arm, Patient Position: Sitting, Cuff Size: Normal)   Pulse (!) 49   Temp 98.1 F (36.7 C) (Oral)   Resp 18   SpO2 98%     Physical Exam Constitutional: Oriented to person, place, and time. Well-developed and well-nourished. No distress.   HENT:  Head: Normocephalic and atraumatic.  Eyes: Conjunctivae and EOM are normal. Right eye exhibits  no discharge. Left eye exhibits no discharge. No scleral icterus.  Neck: No JVD present.  Pulmonary/Chest: Effort normal. No stridor. No respiratory distress.  Abdomen: soft, non distended Neurological:  alert and oriented to person, place, and time.  Skin: Skin is warm and dry.  not diaphoretic.  Psychiatric:   normal mood and affect.   behavior is normal. Judgment and thought content normal.      Imaging: SUB SELECTIVE SPLENIC ARTERIOGRAM   ARTERIAL COIL EMBOLIZATION   ULTRASOUND GUIDANCE FOR VASCULAR ACCESS   MEDICATIONS: cefazolin  2 G. The antibiotic was administered within 1 hour of the procedure   ANESTHESIA/SEDATION: Moderate (conscious) sedation was employed during this procedure. A total of Versed  8 mg and dilaudid  6 mg was administered intravenously by the radiology nurse.   Total intra-service moderate Sedation Time: 210 minutes. The patient's level of consciousness and vital signs were monitored continuously by radiology nursing throughout the procedure under my direct supervision.   CONTRAST:  165 mL Omnipaque  300   FLUOROSCOPY: Radiation Exposure Index (as provided by the fluoroscopic device): 524 mGy Kerma    COMPLICATIONS: None immediate.   PROCEDURE: Informed consent was obtained from the patient following explanation of the procedure, risks, benefits and alternatives. The patient understands, agrees and consents for the procedure. All questions were addressed. A time out was performed prior to the initiation of the procedure. Maximal barrier sterile technique utilized including caps, mask, sterile gowns, sterile gloves, large sterile drape, hand hygiene, and Betadine prep.   Under real-time ultrasound guidance, micropuncture access of the right common femoral artery was achieved. Micropuncture exchanged over a Bentson wire for a 5 French vascular sheath, through which a 5 French C2 catheter was advanced to selectively catheterize the celiac axis. However, distal access to the splenic artery could not be achieved. After using a variety of catheter and guidewire combinations, the sheath was exchanged for a long 6 French vascular sheath. This stabilized 5 French catheter access to the celiac axis and allowed advancement of the high-flow Renegade microcatheter into the splenic artery for splenic arteriography. However, this platform was inadequate to support distal microcatheter embolization.   Subsequently, the sheath was exchanged for a long directional 7 French vascular sheath which achieved durable access into the celiac axis. The 5 French catheter was then advanced into the mid splenic artery. Splenic arteriography obtained.   Coaxial lantern microcatheter was advanced and coil embolization of 4 dominant third-order branches was performed, with intermittent arteriography to assess parenchymal perfusion.   At the conclusion, the microcatheter was removed and final splenic arteriography was performed, demonstrating approximately 50% decrease in overall splenic parenchymal perfusion, continued patency of the main splenic artery.   The catheter and sheath were then removed and  hemostasis achieved at the site with the aid of Celt device under ultrasound guidance. The patient tolerated the procedure well. No immediate complication.   IMPRESSION: 1. Patent celiac axis and splenic artery. 2. Technically successful embolization of four third-order splenic artery branches, reducing parenchymal perfusion by ~50%.     Electronically Signed   By: Noah Griffin M.D.   On: 12/13/2023 09:06    Labs:  CBC: Recent Labs    10/26/23 0656 12/12/23 2111 12/13/23 0513  WBC 4.9 9.2 6.3  HGB 11.1* 11.6* 9.7*  HCT 33.2* 34.7* 27.9*  PLT 122* 139* 96*    COAGS: Recent Labs    10/26/23 0656 12/12/23 1223  INR 1.0 1.0  APTT  --  30    BMP: Recent  Labs    10/26/23 0656 12/12/23 1223 12/13/23 0513  NA 137 137 133*  K 4.2 4.6 3.7  CL 105 104 101  CO2 26 25 24   GLUCOSE 119* 108* 115*  BUN 10 11 11   CALCIUM 8.9 8.6* 8.3*  CREATININE 1.11 1.16 1.05  GFRNONAA >60 >60 >60    LIVER FUNCTION TESTS: Recent Labs    12/13/23 0513  BILITOT 0.9  AST 21  ALT 13  ALKPHOS 70  PROT 5.2*  ALBUMIN 3.3*    TUMOR MARKERS: No results for input(s): AFPTM, CEA, CA199, CHROMGRNA in the last 8760 hours.  Assessment and Plan: My impression is that Noah Griffin did well post partial splenic embolization, with mild expected post-embolization syndrome symptoms which have largely resolved. The expectation is that decreased arterial flow into the spleen leads to lower venous outflow. 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 Anticipate diminution of caliber of the varices post partial splenic embo, lowering the risk of variceal hemorrhage or rupture. It may be prudent to confirm significant decrease in the varices while the pt remains asx. Endoscopy would provide the most relevant data re the submucosal varices of concern. Would suggest delay of 4-6 months to allow time for venous involution.   I discussed my recommendations with the patient. He seemed to understand, and asked appropriate questions, which were answered. He will f/u primarily with Dr. Henreitta Flavors. He knows to call with any interval questions or issues.   Thank you for this interesting consult.  I greatly enjoyed meeting Noah Griffin and look forward to participating in their care.  A copy of this report was sent to the requesting provider on this date.  Electronically Signed: Dayne Hammad Finkler 01/25/2024, 5:08 PM   I spent a total of   40 in face to face in clinical consultation, greater than 50% of which was counseling/coordinating care for gastric varices.

## 2024-02-04 ENCOUNTER — Ambulatory Visit (INDEPENDENT_AMBULATORY_CARE_PROVIDER_SITE_OTHER): Admitting: Gastroenterology

## 2024-02-04 ENCOUNTER — Encounter (INDEPENDENT_AMBULATORY_CARE_PROVIDER_SITE_OTHER): Payer: Self-pay | Admitting: Gastroenterology

## 2024-02-04 VITALS — BP 125/74 | HR 49 | Temp 97.3°F | Ht 70.0 in | Wt 153.0 lb

## 2024-02-04 DIAGNOSIS — I864 Gastric varices: Secondary | ICD-10-CM | POA: Diagnosis not present

## 2024-02-04 DIAGNOSIS — K860 Alcohol-induced chronic pancreatitis: Secondary | ICD-10-CM

## 2024-02-04 NOTE — Patient Instructions (Addendum)
 Can stop pantoprazole , can restart it if presenting recurrent heartburn We will discuss repeating an EGD in follow-up appointment Please reach his back if interested in pursuing colonoscopy for colorectal cancer screening Continue avoiding alcohol and smoking cigarettes

## 2024-02-04 NOTE — Progress Notes (Unsigned)
 Toribio Fortune, M.D. Gastroenterology & Hepatology [facility]astroenter[facility] [address] [place], [place] [address]  Primary Care Physician: Rosamond Leta NOVAK, MD [address] [place] [address]  I will communicate my assessment and recommendations to the referring MD via EMR.  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  The patient was last seen on 11/26/2023. At that time, the patient was advised to proceed with IR embolization .  Patient underwent guided partial subselective splenic arterial embolization approx 50% parenchyma with Dr. Johann on 12/12/2023. Had a short hospitalization after embolization due to pain, which resolved. Seen by him in clinic on 01/25/2024. Recommended to repeat endoscopic evaluation 4-6 months after procedure.  Patient reports feeling well. Denies any pain recently. Denies having any nausea, vomiting, fever, chills, hematochezia, melena, hematemesis, abdominal distention, abdominal pain, diarrhea, jaundice, pruritus or weight loss.  No smoking or alcohol intake.  Last EGD: 08/24/23 - Normal esophagus. - Z- line regular, 40 cm from the incisors. - Type 1 isolated gastric varices ( IGV1, varices located in the fundus) , without bleeding. - Normal examined duodenum.   Last Colonoscopy:11/20/2013 Prep excellent. Normal mucosa of terminal ileum. Few small diverticula at sigmoid colon. Normal rectal mucosa. Focal thickening to anoderm  Past Medical History: Past Medical History:   Diagnosis Date   Anxiety    Esophageal varices (HCC)    Hypertension    Pancreatitis    Thyroid condition     Past Surgical History: Past Surgical History:  Procedure Laterality Date   COLONOSCOPY N/A 11/20/2013   Procedure: COLONOSCOPY;  Surgeon: Claudis RAYMOND Rivet, MD;  [facility];  Service: Endoscopy;  Laterality: N/A;  730-rescheduled same time Ann notified pt   ESOPHAGOGASTRODUODENOSCOPY N/A 08/24/2023   Procedure: EGD (ESOPHAGOGASTRODUODENOSCOPY);  Surgeon: Fortune Flavors, Toribio, MD;  [facility];  Service: Gastroenterology;  Laterality: N/A;  900AM, ASA 1-2   HERNIA REPAIR     IR ANGIOGRAM VISCERAL SELECTIVE  12/12/2023   IR EMBO ART  VEN HEMORR LYMPH EXTRAV  INC GUIDE ROADMAPPING  10/26/2023   IR EMBO ARTERIAL NOT HEMORR HEMANG INC GUIDE ROADMAPPING  12/12/2023   IR RADIOLOGIST EVAL & MGMT  10/03/2023   IR RADIOLOGIST EVAL & MGMT  11/02/2023   IR RADIOLOGIST EVAL & MGMT  11/23/2023   IR US  GUIDE VASC ACCESS RIGHT  10/26/2023   IR US  GUIDE VASC ACCESS RIGHT  12/12/2023   IR VENOGRAM RENAL UNI LEFT  10/26/2023    Family History: Family History  Problem Relation Age of Onset   Parkinsonism Mother    Pancreatic cancer Father    Healthy Son     Social History: Social History   Tobacco Use  Smoking Status Former   Types: Cigars  Smokeless Tobacco Never  Tobacco Comments   patient smokes 1 cigar a day   Social History   Substance and Sexual Activity  Alcohol Use No   Comment: Quit using alcohol 3-4 years ago following acute Pancreatitis Attack   Social History   Substance and Sexual Activity  Drug Use No    Allergies: No Known Allergies  Medications: Current Outpatient Medications  Medication Sig Dispense Refill   amLODipine  (NORVASC ) 10 MG tablet Take 10 mg by mouth 2 (two) times daily.     baclofen (LIORESAL) 10 MG tablet Take 10 mg by mouth daily.     clonazePAM  (KLONOPIN ) 0.5 MG tablet Take 0.5 mg by mouth at bedtime. (Patient  taking differently: Take 1 mg by mouth at bedtime.)     escitalopram  (LEXAPRO ) 20 MG tablet Take 20 mg by mouth daily.     Levothyroxine  Sodium 200 MCG/ML SOLN Take 250 mcg by mouth daily before breakfast.     Oxycodone  HCl 10 MG TABS Take 1 tablet (10 mg total) by mouth every 6 (six) hours as needed for severe pain (pain score 7-10) or breakthrough pain. 20 tablet 0   pantoprazole  (PROTONIX ) 40 MG tablet Take 40 mg by mouth daily.     traZODone  (DESYREL ) 50 MG tablet Take 100 mg by mouth at bedtime.      valsartan (DIOVAN) 160 MG tablet Take 160 mg by mouth daily. (Patient taking differently: Take 320 mg by mouth daily.)     No current facility-administered medications for this visit.    Review of Systems: GENERAL: negative for malaise, night sweats HEENT: No changes in hearing or vision, no nose bleeds or other nasal problems. NECK: Negative for lumps, goiter, pain and significant neck swelling RESPIRATORY: Negative for cough, wheezing CARDIOVASCULAR: Negative for chest pain, leg swelling, palpitations, orthopnea GI: SEE HPI MUSCULOSKELETAL: Negative for joint pain or swelling, back pain, and muscle pain. SKIN: Negative for lesions, rash PSYCH: Negative for sleep disturbance, mood disorder and recent psychosocial stressors. HEMATOLOGY Negative for prolonged bleeding, bruising easily, and swollen nodes. ENDOCRINE: Negative for cold or heat intolerance, polyuria, polydipsia and goiter. NEURO: negative for tremor, gait imbalance, syncope and seizures. The remainder of the review of systems is noncontributory.   Physical Exam: BP 125/74   Pulse (!) 49   Temp (!) 97.3 F (36.3 C)   Ht 5' 10 (1.778 m)   Wt 153 lb (69.4 kg)   BMI 21.95 kg/m  GENERAL: The patient is AO x3, in no acute distress. HEENT: Head is normocephalic and atraumatic. EOMI are intact. Mouth is well hydrated and without lesions. NECK: Supple. No masses LUNGS: Clear to auscultation. No presence of  rhonchi/wheezing/rales. Adequate chest expansion HEART: RRR, normal s1 and s2. ABDOMEN: Soft, nontender, no guarding, no peritoneal signs, and nondistended. BS +. No masses. EXTREMITIES: Without any cyanosis, clubbing, rash, lesions or edema. NEUROLOGIC: AOx3, no focal motor deficit. SKIN: no jaundice, no rashes  Imaging/Labs: as above  I personally reviewed and interpreted the available labs, imaging and endoscopic files.  Impression and Plan: Noah Griffin is a 68 y.o. 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  The patient has done well through undergoing IR procedure for partial embolization of the spleen.  He has felt well and denies any ongoing symptoms.  No concerns for gastrointestinal bleeding at the moment.  At this point, we discussed the need to perform endoscopic surveillance of his varices 6 months after his procedure, which will help us  determine if varices are stable or may have decrease in size.  He is in agreement with this.  Patient inquired about the need to continue taking pantoprazole  as he has never had GERD and this medication was prescribed for possible esophageal varices in the past.  I advised him to stop this medication for now but he can restart it if the recurrent symptoms.  In terms of his chronic pancreatitis, he does not have any complaints at the moment and does not present any symptoms concerning for EPI.  Will continue monitoring for now and he was advised to continue avoiding alcohol and cigarettes.  Finally, I encouraged him to proceed with colorectal cancer screening colonoscopy but he is still hesitant to proceed with this.  He will call us  if interested in proceeding with colonoscopy.  -Can stop pantoprazole , can restart it if presenting  recurrent heartburn -We will discuss repeating an EGD in follow-up appointment - 6 months post embolization -Please reach his back if interested in pursuing colonoscopy for -colorectal cancer screening -Continue avoiding alcohol and smoking cigarettes  All questions were answered.      Toribio Fortune, MD Gastroenterology and Hepatology [facility] Gastroenterology
